# Patient Record
Sex: Male | Born: 1960 | Race: White | Hispanic: No | Marital: Single | Smoking: Former smoker
Health system: Southern US, Community
[De-identification: ages and names within clinical notes are randomized; demographics above are authoritative.]

## PROBLEM LIST (undated history)

## (undated) DIAGNOSIS — R7401 Elevation of levels of liver transaminase levels: Secondary | ICD-10-CM

## (undated) DIAGNOSIS — E119 Type 2 diabetes mellitus without complications: Secondary | ICD-10-CM

## (undated) DIAGNOSIS — K219 Gastro-esophageal reflux disease without esophagitis: Secondary | ICD-10-CM

## (undated) DIAGNOSIS — K769 Liver disease, unspecified: Secondary | ICD-10-CM

## (undated) DIAGNOSIS — M1990 Unspecified osteoarthritis, unspecified site: Secondary | ICD-10-CM

## (undated) DIAGNOSIS — I10 Essential (primary) hypertension: Secondary | ICD-10-CM

## (undated) DIAGNOSIS — E7800 Pure hypercholesterolemia, unspecified: Secondary | ICD-10-CM

## (undated) DIAGNOSIS — R740 Nonspecific elevation of levels of transaminase and lactic acid dehydrogenase [LDH]: Secondary | ICD-10-CM

## (undated) HISTORY — DX: Unspecified osteoarthritis, unspecified site: M19.90

## (undated) HISTORY — PX: COLONOSCOPY: SHX174

## (undated) HISTORY — PX: OTHER SURGICAL HISTORY: SHX169

## (undated) HISTORY — DX: Type 2 diabetes mellitus without complications: E11.9

---

## 2008-09-29 ENCOUNTER — Ambulatory Visit: Payer: Self-pay | Admitting: Anesthesiology

## 2009-02-03 ENCOUNTER — Ambulatory Visit: Payer: Self-pay | Admitting: Anesthesiology

## 2011-08-18 ENCOUNTER — Ambulatory Visit: Payer: Self-pay | Admitting: Pain Medicine

## 2011-08-31 ENCOUNTER — Emergency Department: Payer: Self-pay | Admitting: Emergency Medicine

## 2011-08-31 LAB — CBC
HGB: 14.6 g/dL (ref 13.0–18.0)
MCH: 28.7 pg (ref 26.0–34.0)
MCV: 85 fL (ref 80–100)
Platelet: 223 10*3/uL (ref 150–440)
RBC: 5.07 10*6/uL (ref 4.40–5.90)

## 2011-08-31 LAB — BASIC METABOLIC PANEL
Anion Gap: 8 (ref 7–16)
BUN: 14 mg/dL (ref 7–18)
Calcium, Total: 9 mg/dL (ref 8.5–10.1)
EGFR (African American): >60
EGFR (Non-African Amer.): >60
Glucose: 103 mg/dL — ABNORMAL HIGH (ref 65–99)
Osmolality: 280 (ref 275–301)
Potassium: 3.9 mmol/L (ref 3.5–5.1)

## 2013-01-20 ENCOUNTER — Ambulatory Visit: Payer: Self-pay | Admitting: Physician Assistant

## 2013-03-01 ENCOUNTER — Emergency Department: Payer: Self-pay | Admitting: Emergency Medicine

## 2013-03-01 LAB — URINALYSIS, COMPLETE
BILIRUBIN, UR: NEGATIVE
Bacteria: NONE SEEN
Blood: NEGATIVE
Glucose,UR: NEGATIVE mg/dL (ref 0–75)
Ketone: NEGATIVE
Leukocyte Esterase: NEGATIVE
Nitrite: NEGATIVE
Ph: 5 (ref 4.5–8.0)
Protein: NEGATIVE
RBC,UR: <1 /HPF (ref 0–5)
Specific Gravity: 1.012 (ref 1.003–1.030)
Squamous Epithelial: NONE SEEN
WBC UR: 1 /HPF (ref 0–5)

## 2013-03-01 LAB — LIPASE, BLOOD: Lipase: 192 U/L (ref 73–393)

## 2013-03-01 LAB — CBC
HCT: 46.2 % (ref 40.0–52.0)
HGB: 15.5 g/dL (ref 13.0–18.0)
MCH: 27.8 pg (ref 26.0–34.0)
MCHC: 33.5 g/dL (ref 32.0–36.0)
MCV: 83 fL (ref 80–100)
Platelet: 230 10*3/uL (ref 150–440)
RBC: 5.57 10*6/uL (ref 4.40–5.90)
RDW: 14.6 % — ABNORMAL HIGH (ref 11.5–14.5)
WBC: 13.2 10*3/uL — ABNORMAL HIGH (ref 3.8–10.6)

## 2013-03-01 LAB — COMPREHENSIVE METABOLIC PANEL
ALT: 29 U/L (ref 12–78)
ANION GAP: 4 — AB (ref 7–16)
Albumin: 4.3 g/dL (ref 3.4–5.0)
Alkaline Phosphatase: 124 U/L — ABNORMAL HIGH
BILIRUBIN TOTAL: 0.4 mg/dL (ref 0.2–1.0)
BUN: 18 mg/dL (ref 7–18)
CHLORIDE: 102 mmol/L (ref 98–107)
Calcium, Total: 9.1 mg/dL (ref 8.5–10.1)
Co2: 28 mmol/L (ref 21–32)
Creatinine: 0.95 mg/dL (ref 0.60–1.30)
EGFR (African American): >60
EGFR (Non-African Amer.): >60
GLUCOSE: 81 mg/dL (ref 65–99)
Osmolality: 269 (ref 275–301)
Potassium: 3.4 mmol/L — ABNORMAL LOW (ref 3.5–5.1)
SGOT(AST): 28 U/L (ref 15–37)
Sodium: 134 mmol/L — ABNORMAL LOW (ref 136–145)
Total Protein: 8.5 g/dL — ABNORMAL HIGH (ref 6.4–8.2)

## 2013-03-19 ENCOUNTER — Ambulatory Visit: Payer: Self-pay | Admitting: Unknown Physician Specialty

## 2013-08-14 ENCOUNTER — Ambulatory Visit: Payer: Self-pay | Admitting: Internal Medicine

## 2013-08-14 LAB — CBC CANCER CENTER
BASOS ABS: 0.1 x10 3/mm (ref 0.0–0.1)
Basophil %: 0.8 %
EOS ABS: 0.3 x10 3/mm (ref 0.0–0.7)
Eosinophil %: 2.6 %
HCT: 48.2 % (ref 40.0–52.0)
HGB: 15.9 g/dL (ref 13.0–18.0)
LYMPHS PCT: 20.1 %
Lymphocyte #: 2 x10 3/mm (ref 1.0–3.6)
MCH: 28.9 pg (ref 26.0–34.0)
MCHC: 32.9 g/dL (ref 32.0–36.0)
MCV: 88 fL (ref 80–100)
MONOS PCT: 7.5 %
Monocyte #: 0.7 x10 3/mm (ref 0.2–1.0)
NEUTROS PCT: 69 %
Neutrophil #: 6.8 x10 3/mm — ABNORMAL HIGH (ref 1.4–6.5)
Platelet: 230 x10 3/mm (ref 150–440)
RBC: 5.5 10*6/uL (ref 4.40–5.90)
RDW: 13.6 % (ref 11.5–14.5)
WBC: 9.9 x10 3/mm (ref 3.8–10.6)

## 2013-08-14 LAB — CREATININE, SERUM
Creatinine: 0.96 mg/dL (ref 0.60–1.30)
EGFR (African American): >60
EGFR (Non-African Amer.): >60

## 2013-08-14 LAB — LACTATE DEHYDROGENASE: LDH: 202 U/L (ref 85–241)

## 2013-09-06 ENCOUNTER — Ambulatory Visit: Payer: Self-pay | Admitting: Internal Medicine
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
## 2015-06-25 ENCOUNTER — Encounter: Payer: Self-pay | Admitting: *Deleted

## 2015-06-28 ENCOUNTER — Encounter: Payer: Self-pay | Admitting: *Deleted

## 2015-06-28 ENCOUNTER — Ambulatory Visit
Admission: RE | Admit: 2015-06-28 | Discharge: 2015-06-28 | Disposition: A | Discharge status: Home / Self Care | Payer: Medicaid Other | Source: Ambulatory Visit | Attending: Gastroenterology | Admitting: Gastroenterology

## 2015-06-28 ENCOUNTER — Ambulatory Visit: Payer: Medicaid Other | Admitting: Certified Registered Nurse Anesthetist

## 2015-06-28 ENCOUNTER — Encounter
Admission: RE | Disposition: A | Discharge status: Home / Self Care | Payer: Self-pay | Source: Ambulatory Visit | Attending: Gastroenterology

## 2015-06-28 DIAGNOSIS — K210 Gastro-esophageal reflux disease with esophagitis: Secondary | ICD-10-CM | POA: Diagnosis not present

## 2015-06-28 DIAGNOSIS — I10 Essential (primary) hypertension: Secondary | ICD-10-CM | POA: Insufficient documentation

## 2015-06-28 DIAGNOSIS — Z79899 Other long term (current) drug therapy: Secondary | ICD-10-CM | POA: Insufficient documentation

## 2015-06-28 DIAGNOSIS — E7800 Pure hypercholesterolemia, unspecified: Secondary | ICD-10-CM | POA: Insufficient documentation

## 2015-06-28 DIAGNOSIS — Z1211 Encounter for screening for malignant neoplasm of colon: Secondary | ICD-10-CM | POA: Insufficient documentation

## 2015-06-28 HISTORY — DX: Pure hypercholesterolemia, unspecified: E78.00

## 2015-06-28 HISTORY — PX: ESOPHAGOGASTRODUODENOSCOPY (EGD) WITH PROPOFOL: SHX5813

## 2015-06-28 HISTORY — DX: Elevation of levels of liver transaminase levels: R74.01

## 2015-06-28 HISTORY — DX: Gastro-esophageal reflux disease without esophagitis: K21.9

## 2015-06-28 HISTORY — PX: COLONOSCOPY WITH PROPOFOL: SHX5780

## 2015-06-28 HISTORY — DX: Nonspecific elevation of levels of transaminase and lactic acid dehydrogenase (ldh): R74.0

## 2015-06-28 HISTORY — DX: Liver disease, unspecified: K76.9

## 2015-06-28 HISTORY — DX: Essential (primary) hypertension: I10

## 2015-06-28 SURGERY — COLONOSCOPY WITH PROPOFOL
Anesthesia: General

## 2015-06-28 MED ORDER — SODIUM CHLORIDE 0.9 % IV SOLN: INTRAVENOUS | Status: DC

## 2015-06-28 MED ORDER — FENTANYL CITRATE (PF) 100 MCG/2ML IJ SOLN
INTRAMUSCULAR | Status: DC | PRN
  Administered 2015-06-28: 50 ug via INTRAVENOUS

## 2015-06-28 MED ORDER — LIDOCAINE HCL (CARDIAC) 20 MG/ML IV SOLN
INTRAVENOUS | Status: DC | PRN
  Administered 2015-06-28: 100 mg via INTRAVENOUS

## 2015-06-28 MED ORDER — SODIUM CHLORIDE 0.9 % IV SOLN
INTRAVENOUS | Status: DC
  Administered 2015-06-28: 14:00:00 via INTRAVENOUS

## 2015-06-28 MED ORDER — PROPOFOL 500 MG/50ML IV EMUL
INTRAVENOUS | Status: DC | PRN
  Administered 2015-06-28: 140 ug/kg/min via INTRAVENOUS

## 2015-06-28 MED ORDER — PROPOFOL 10 MG/ML IV BOLUS
INTRAVENOUS | Status: DC | PRN
  Administered 2015-06-28: 80 mg via INTRAVENOUS
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
## 2015-06-28 NOTE — Anesthesia Preprocedure Evaluation (Signed)
Anesthesia Evaluation  Patient identified by MRN, date of birth, ID band Patient awake    Reviewed: Allergy & Precautions, NPO status , Patient's Chart, lab work & pertinent test results  History of Anesthesia Complications Negative for: history of anesthetic complications  Airway Mallampati: III       Dental   Pulmonary neg pulmonary ROS,           Cardiovascular hypertension, Pt. on medications      Neuro/Psych negative neurological ROS     GI/Hepatic Neg liver ROS, GERD  Medicated and Controlled,  Endo/Other  negative endocrine ROS  Renal/GU negative Renal ROS     Musculoskeletal   Abdominal   Peds  Hematology negative hematology ROS (+)   Anesthesia Other Findings   Reproductive/Obstetrics                             Anesthesia Physical Anesthesia Plan  ASA: II  Anesthesia Plan: General   Post-op Pain Management:    Induction: Intravenous  Airway Management Planned: Nasal Cannula  Additional Equipment:   Intra-op Plan:   Post-operative Plan:   Informed Consent: I have reviewed the patients History and Physical, chart, labs and discussed the procedure including the risks, benefits and alternatives for the proposed anesthesia with the patient or authorized representative who has indicated his/her understanding and acceptance.     Plan Discussed with:   Anesthesia Plan Comments:         Anesthesia Quick Evaluation

## 2015-06-28 NOTE — Op Note (Signed)
[facility] Gastroenterology Patient Name: Colton Whitehead Procedure Date: 06/28/2015 2:32 PM MRN: [id number] Account #: [id number] Date of Birth: [birth year] Admit Type: Outpatient Age: 55 Room: [facility] ENDO ROOM 4 Gender: Male Note Status: Finalized Procedure:            Upper GI endoscopy Indications:          Suspected esophageal reflux Providers:            Lupita Dawn. Candace Cruise, MD Referring MD:         Leighton Ruff. Radford Pax, RN (Referring MD) Medicines:            Monitored Anesthesia Care Complications:        No immediate complications. Procedure:            Pre-Anesthesia Assessment:                       - Prior to the procedure, a History and Physical was                        performed, and patient medications, allergies and                        sensitivities were reviewed. The patient's tolerance of                        previous anesthesia was reviewed.                       - The risks and benefits of the procedure and the                        sedation options and risks were discussed with the                        patient. All questions were answered and informed                        consent was obtained.                       - After reviewing the risks and benefits, the patient                        was deemed in satisfactory condition to undergo the                        procedure.                       After obtaining informed consent, the endoscope was                        passed under direct vision. Throughout the procedure,                        the patient's blood pressure, pulse, and oxygen                        saturations were monitored continuously. The Endoscope  was introduced through the mouth, and advanced to the                        second part of duodenum. The upper GI endoscopy was                        accomplished without difficulty. The patient tolerated                        the procedure well. Findings:     LA Grade A (one or more mucosal breaks less than 5 mm, not extending       between tops of 2 mucosal folds) esophagitis was found at the       gastroesophageal junction. Biopsies were taken with a cold forceps for       histology.      The exam was otherwise without abnormality.      The entire examined stomach was normal.      The examined duodenum was normal. Impression:           - LA Grade A reflux esophagitis. Rule out Barrett's                        esophagus. Biopsied.                       - The examination was otherwise normal.                       - Normal stomach.                       - Normal examined duodenum. Recommendation:       - Discharge patient to home.                       - Observe patient's clinical course.                       - Continue present medications.                       - Await pathology results.                       - The findings and recommendations were discussed with                        the patient.                       - Chck for Barrett's Procedure Code(s):    --- Professional ---                       [phone], Esophagogastroduodenoscopy, flexible, transoral;                        with biopsy, single or multiple Diagnosis Code(s):    --- Professional ---                       K21.0, Gastro-esophageal reflux disease with esophagitis CPT copyright 2016 American Medical Association. All rights reserved. The codes documented in this report are preliminary and upon  coder review may  be revised to meet current compliance requirements. Hulen Luster, MD 06/28/2015 2:50:29 PM This report has been signed electronically. Number of Addenda: 0 Note Initiated On: 06/28/2015 2:32 PM      [facility]

## 2015-06-28 NOTE — Transfer of Care (Signed)
Immediate Anesthesia Transfer of Care Note  Patient: Colton Whitehead.  Procedure(s) Performed: Procedure(s): COLONOSCOPY WITH PROPOFOL (N/A) ESOPHAGOGASTRODUODENOSCOPY (EGD) WITH PROPOFOL (N/A)  Patient Location: Endoscopy Unit  Anesthesia Type:General  Level of Consciousness: awake, alert , oriented and patient cooperative  Airway & Oxygen Therapy: Patient Spontanous Breathing and Patient connected to nasal cannula oxygen  Post-op Assessment: Report given to RN, Post -op Vital signs reviewed and stable and Patient moving all extremities X 4  Post vital signs: Reviewed and stable  Last Vitals:  Filed Vitals:   06/28/15 1340  BP: 120/76  Pulse: 72  Temp: 36.2 C  Resp: 16    Last Pain:  Filed Vitals:   06/28/15 1342  PainSc: 5          Complications: No apparent anesthesia complications

## 2015-06-28 NOTE — Anesthesia Postprocedure Evaluation (Signed)
Anesthesia Post Note  Patient: Colton Whitehead.  Procedure(s) Performed: Procedure(s) (LRB): COLONOSCOPY WITH PROPOFOL (N/A) ESOPHAGOGASTRODUODENOSCOPY (EGD) WITH PROPOFOL (N/A)  Patient location during evaluation: PACU Anesthesia Type: General Level of consciousness: awake and alert Pain management: pain level controlled Vital Signs Assessment: post-procedure vital signs reviewed and stable Respiratory status: spontaneous breathing and respiratory function stable Cardiovascular status: stable Anesthetic complications: no    Last Vitals:  Filed Vitals:   06/28/15 1524 06/28/15 1534  BP: 112/79 118/84  Pulse: 63 64  Temp:    Resp: 16 17    Last Pain:  Filed Vitals:   06/28/15 1539  PainSc: 5                  Dietrick Barris K

## 2015-06-28 NOTE — Op Note (Signed)
[facility] Gastroenterology Patient Name: Colton Whitehead Procedure Date: 06/28/2015 2:31 PM MRN: [id number] Account #: [id number] Date of Birth: 11-06-1960 Admit Type: Outpatient Age: 55 Room: [facility] ENDO ROOM 4 Gender: Male Note Status: Finalized Procedure:            Colonoscopy Indications:          Screening for colorectal malignant neoplasm Providers:            Lupita Dawn. Candace Cruise, MD Medicines:            Monitored Anesthesia Care Complications:        No immediate complications. Procedure:            Pre-Anesthesia Assessment:                       - Prior to the procedure, a History and Physical was                        performed, and patient medications, allergies and                        sensitivities were reviewed. The patient's tolerance of                        previous anesthesia was reviewed.                       - The risks and benefits of the procedure and the                        sedation options and risks were discussed with the                        patient. All questions were answered and informed                        consent was obtained.                       - After reviewing the risks and benefits, the patient                        was deemed in satisfactory condition to undergo the                        procedure.                       After obtaining informed consent, the colonoscope was                        passed under direct vision. Throughout the procedure,                        the patient's blood pressure, pulse, and oxygen                        saturations were monitored continuously. The                        Colonoscope was introduced through the anus and  advanced to the the cecum, identified by appendiceal                        orifice and ileocecal valve. The colonoscopy was                        performed without difficulty. The patient tolerated the                        procedure well. The  quality of the bowel preparation                        was fair. Findings:      The colon (entire examined portion) appeared normal. Impression:           - Preparation of the colon was fair.                       - The entire examined colon is normal.                       - No specimens collected. Recommendation:       - Discharge patient to home.                       - Repeat colonoscopy in 10 years for surveillance.                       - The findings and recommendations were discussed with                        the patient. Procedure Code(s):    --- Professional ---                       [phone], Colonoscopy, flexible; diagnostic, including                        collection of specimen(s) by brushing or washing, when                        performed (separate procedure) Diagnosis Code(s):    --- Professional ---                       Z12.11, Encounter for screening for malignant neoplasm                        of colon CPT copyright 2016 American Medical Association. All rights reserved. The codes documented in this report are preliminary and upon coder review may  be revised to meet current compliance requirements. Hulen Luster, MD 06/28/2015 2:59:00 PM This report has been signed electronically. Number of Addenda: 0 Note Initiated On: 06/28/2015 2:31 PM Scope Withdrawal Time: 0 hours 4 minutes 9 seconds  Total Procedure Duration: 0 hours 6 minutes 4 seconds       [facility]

## 2015-06-29 ENCOUNTER — Encounter: Payer: Self-pay | Admitting: Gastroenterology

## 2015-06-30 LAB — SURGICAL PATHOLOGY

## 2015-09-27 ENCOUNTER — Other Ambulatory Visit: Payer: Self-pay | Admitting: Gastroenterology

## 2015-09-27 DIAGNOSIS — R188 Other ascites: Secondary | ICD-10-CM

## 2015-09-27 DIAGNOSIS — B182 Chronic viral hepatitis C: Secondary | ICD-10-CM

## 2015-10-01 ENCOUNTER — Other Ambulatory Visit: Payer: Self-pay | Admitting: Gastroenterology

## 2015-10-01 ENCOUNTER — Ambulatory Visit
Admission: RE | Admit: 2015-10-01 | Discharge: 2015-10-01 | Disposition: A | Discharge status: Home / Self Care | Payer: Medicaid Other | Source: Ambulatory Visit | Attending: Gastroenterology | Admitting: Gastroenterology

## 2015-10-01 DIAGNOSIS — R188 Other ascites: Secondary | ICD-10-CM

## 2015-10-01 DIAGNOSIS — B182 Chronic viral hepatitis C: Secondary | ICD-10-CM

## 2015-11-11 ENCOUNTER — Other Ambulatory Visit: Payer: Self-pay | Admitting: Gastroenterology

## 2015-11-11 DIAGNOSIS — R1084 Generalized abdominal pain: Secondary | ICD-10-CM

## 2015-11-11 DIAGNOSIS — R635 Abnormal weight gain: Secondary | ICD-10-CM

## 2015-11-15 ENCOUNTER — Ambulatory Visit
Admission: RE | Admit: 2015-11-15 | Discharge: 2015-11-15 | Disposition: A | Discharge status: Home / Self Care | Payer: Medicaid Other | Source: Ambulatory Visit | Attending: Gastroenterology | Admitting: Gastroenterology

## 2015-11-15 DIAGNOSIS — K760 Fatty (change of) liver, not elsewhere classified: Secondary | ICD-10-CM | POA: Insufficient documentation

## 2015-11-15 DIAGNOSIS — N329 Bladder disorder, unspecified: Secondary | ICD-10-CM | POA: Insufficient documentation

## 2015-11-15 DIAGNOSIS — N420 Calculus of prostate: Secondary | ICD-10-CM | POA: Insufficient documentation

## 2015-11-15 DIAGNOSIS — R635 Abnormal weight gain: Secondary | ICD-10-CM | POA: Insufficient documentation

## 2015-11-15 DIAGNOSIS — R1084 Generalized abdominal pain: Secondary | ICD-10-CM | POA: Diagnosis not present

## 2015-11-15 MED ORDER — IOPAMIDOL (ISOVUE-370) INJECTION 76%
100.0000 mL | Freq: Once | INTRAVENOUS | Status: AC | PRN
  Administered 2015-11-15: 100 mL via INTRAVENOUS

## 2016-04-05 ENCOUNTER — Encounter: Payer: Self-pay | Admitting: *Deleted

## 2016-04-06 ENCOUNTER — Encounter: Payer: Self-pay | Admitting: General Surgery

## 2016-04-06 ENCOUNTER — Ambulatory Visit (INDEPENDENT_AMBULATORY_CARE_PROVIDER_SITE_OTHER): Payer: Medicaid Other | Admitting: General Surgery

## 2016-04-06 VITALS — Ht 69.0 in | Wt 248.0 lb

## 2016-04-06 DIAGNOSIS — M6208 Separation of muscle (nontraumatic), other site: Secondary | ICD-10-CM
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
## 2016-06-29 ENCOUNTER — Other Ambulatory Visit: Payer: Self-pay | Admitting: Gastroenterology

## 2016-06-29 DIAGNOSIS — K760 Fatty (change of) liver, not elsewhere classified: Secondary | ICD-10-CM

## 2016-06-29 DIAGNOSIS — B182 Chronic viral hepatitis C: Secondary | ICD-10-CM

## 2016-07-05 ENCOUNTER — Ambulatory Visit
Admission: RE | Admit: 2016-07-05 | Discharge: 2016-07-05 | Disposition: A | Discharge status: Home / Self Care | Payer: Medicaid Other | Source: Ambulatory Visit | Attending: Gastroenterology | Admitting: Gastroenterology

## 2016-07-05 DIAGNOSIS — K760 Fatty (change of) liver, not elsewhere classified: Secondary | ICD-10-CM

## 2016-07-05 DIAGNOSIS — B182 Chronic viral hepatitis C: Secondary | ICD-10-CM

## 2016-07-06 ENCOUNTER — Ambulatory Visit
Admission: RE | Admit: 2016-07-06 | Discharge: 2016-07-06 | Disposition: A | Discharge status: Home / Self Care | Payer: Medicaid Other | Source: Ambulatory Visit | Attending: Gastroenterology | Admitting: Gastroenterology

## 2016-07-06 DIAGNOSIS — B182 Chronic viral hepatitis C: Secondary | ICD-10-CM | POA: Diagnosis present

## 2016-07-06 DIAGNOSIS — K769 Liver disease, unspecified: Secondary | ICD-10-CM | POA: Diagnosis not present

## 2016-07-06 DIAGNOSIS — R188 Other ascites: Secondary | ICD-10-CM | POA: Diagnosis present

## 2016-07-06 DIAGNOSIS — K760 Fatty (change of) liver, not elsewhere classified: Secondary | ICD-10-CM | POA: Diagnosis present

## 2016-11-21 ENCOUNTER — Other Ambulatory Visit
Admission: RE | Admit: 2016-11-21 | Discharge: 2016-11-21 | Disposition: A | Discharge status: Home / Self Care | Payer: Medicaid Other | Source: Ambulatory Visit | Attending: Nurse Practitioner | Admitting: Nurse Practitioner

## 2016-11-21 DIAGNOSIS — I10 Essential (primary) hypertension: Secondary | ICD-10-CM | POA: Insufficient documentation

## 2016-11-21 LAB — COMPREHENSIVE METABOLIC PANEL
ALBUMIN: 4.2 g/dL (ref 3.5–5.0)
ALK PHOS: 103 U/L (ref 38–126)
ALT: 150 U/L — AB (ref 17–63)
ANION GAP: 14 (ref 5–15)
AST: 133 U/L — AB (ref 15–41)
BUN: 11 mg/dL (ref 6–20)
CO2: 25 mmol/L (ref 22–32)
CREATININE: 0.74 mg/dL (ref 0.61–1.24)
Calcium: 9.6 mg/dL (ref 8.9–10.3)
Chloride: 96 mmol/L — ABNORMAL LOW (ref 101–111)
GFR calc Af Amer: >60 mL/min (ref >60)
GFR calc non Af Amer: >60 mL/min (ref >60)
GLUCOSE: 177 mg/dL — AB (ref 65–99)
Potassium: 3.8 mmol/L (ref 3.5–5.1)
SODIUM: 135 mmol/L (ref 135–145)
TOTAL PROTEIN: 8.3 g/dL — AB (ref 6.5–8.1)
Total Bilirubin: 1.1 mg/dL (ref 0.3–1.2)

## 2017-02-15 ENCOUNTER — Other Ambulatory Visit: Payer: Self-pay | Admitting: Gastroenterology

## 2017-02-15 DIAGNOSIS — R945 Abnormal results of liver function studies: Principal | ICD-10-CM

## 2017-02-15 DIAGNOSIS — R7989 Other specified abnormal findings of blood chemistry: Secondary | ICD-10-CM

## 2017-02-21 ENCOUNTER — Ambulatory Visit: Payer: Medicaid Other

## 2017-03-16 ENCOUNTER — Ambulatory Visit
Admission: RE | Admit: 2017-03-16 | Discharge: 2017-03-16 | Disposition: A | Discharge status: Home / Self Care | Payer: Medicaid Other | Source: Ambulatory Visit | Attending: Gastroenterology | Admitting: Gastroenterology

## 2017-03-16 DIAGNOSIS — R945 Abnormal results of liver function studies: Secondary | ICD-10-CM | POA: Insufficient documentation

## 2017-03-16 DIAGNOSIS — K8020 Calculus of gallbladder without cholecystitis without obstruction: Secondary | ICD-10-CM | POA: Diagnosis not present

## 2017-03-16 DIAGNOSIS — K760 Fatty (change of) liver, not elsewhere classified: Secondary | ICD-10-CM | POA: Diagnosis not present

## 2017-03-16 DIAGNOSIS — R7989 Other specified abnormal findings of blood chemistry: Secondary | ICD-10-CM

## 2017-03-20 ENCOUNTER — Ambulatory Visit: Payer: Medicaid Other | Admitting: Nurse Practitioner

## 2017-03-20 ENCOUNTER — Encounter: Payer: Self-pay | Admitting: Nurse Practitioner

## 2017-03-20 VITALS — BP 129/86 | HR 98 | Resp 16 | Ht 69.0 in | Wt 229.8 lb

## 2017-03-20 DIAGNOSIS — E1165 Type 2 diabetes mellitus with hyperglycemia: Secondary | ICD-10-CM | POA: Diagnosis not present

## 2017-03-20 DIAGNOSIS — I10 Essential (primary) hypertension: Secondary | ICD-10-CM

## 2017-03-20 DIAGNOSIS — IMO0002 Reserved for concepts with insufficient information to code with codable children: Secondary | ICD-10-CM | POA: Insufficient documentation

## 2017-03-20 DIAGNOSIS — K219 Gastro-esophageal reflux disease without esophagitis: Secondary | ICD-10-CM | POA: Diagnosis not present

## 2017-03-20 DIAGNOSIS — E1159 Type 2 diabetes mellitus with other circulatory complications: Secondary | ICD-10-CM | POA: Insufficient documentation

## 2017-03-20 LAB — POCT GLYCOSYLATED HEMOGLOBIN (HGB A1C): Hemoglobin A1C: 6.6

## 2017-03-20 MED ORDER — ESOMEPRAZOLE MAGNESIUM 40 MG PO CPDR: 40.0000 mg | DELAYED_RELEASE_CAPSULE | Freq: Every day | ORAL | 5 refills | Status: DC
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
## 2017-03-28 DIAGNOSIS — K219 Gastro-esophageal reflux disease without esophagitis: Secondary | ICD-10-CM | POA: Insufficient documentation

## 2017-05-21 ENCOUNTER — Other Ambulatory Visit: Payer: Self-pay | Admitting: Internal Medicine

## 2017-06-19 ENCOUNTER — Encounter: Payer: Self-pay | Admitting: Nurse Practitioner

## 2017-06-19 ENCOUNTER — Ambulatory Visit: Payer: Medicaid Other | Admitting: Nurse Practitioner

## 2017-06-19 VITALS — BP 130/89 | HR 98 | Resp 16 | Ht 69.0 in | Wt 230.8 lb

## 2017-06-19 DIAGNOSIS — E11649 Type 2 diabetes mellitus with hypoglycemia without coma: Secondary | ICD-10-CM

## 2017-06-19 DIAGNOSIS — K219 Gastro-esophageal reflux disease without esophagitis: Secondary | ICD-10-CM | POA: Diagnosis not present

## 2017-06-19 DIAGNOSIS — Z23 Encounter for immunization: Secondary | ICD-10-CM | POA: Diagnosis not present

## 2017-06-19 DIAGNOSIS — S30861A Insect bite (nonvenomous) of abdominal wall, initial encounter: Secondary | ICD-10-CM | POA: Diagnosis not present

## 2017-06-19 DIAGNOSIS — I10 Essential (primary) hypertension: Secondary | ICD-10-CM

## 2017-06-19 DIAGNOSIS — W57XXXA Bitten or stung by nonvenomous insect and other nonvenomous arthropods, initial encounter: Secondary | ICD-10-CM

## 2017-06-19 LAB — POCT GLYCOSYLATED HEMOGLOBIN (HGB A1C): HEMOGLOBIN A1C: 7.1

## 2017-06-19 MED ORDER — PNEUMOCOCCAL VAC POLYVALENT 25 MCG/0.5ML IJ INJ: 0.5000 mL | INJECTION | INTRAMUSCULAR | 0 refills | Status: AC

## 2017-06-19 MED ORDER — DOXYCYCLINE HYCLATE 100 MG PO TABS: 100.0000 mg | ORAL_TABLET | Freq: Two times a day (BID) | ORAL | 0 refills | Status: DC

## 2017-06-19 MED ORDER — METFORMIN HCL 500 MG PO TABS: 250.0000 mg | ORAL_TABLET | Freq: Two times a day (BID) | ORAL | 3 refills | Status: DC
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
## 2017-07-22 ENCOUNTER — Emergency Department: Payer: Medicaid Other

## 2017-07-22 ENCOUNTER — Emergency Department
Admission: EM | Admit: 2017-07-22 | Discharge: 2017-07-22 | Disposition: A | Discharge status: Home / Self Care | Payer: Medicaid Other | Attending: Emergency Medicine | Admitting: Emergency Medicine

## 2017-07-22 ENCOUNTER — Other Ambulatory Visit: Payer: Self-pay

## 2017-07-22 ENCOUNTER — Encounter: Payer: Self-pay | Admitting: Emergency Medicine

## 2017-07-22 DIAGNOSIS — Y939 Activity, unspecified: Secondary | ICD-10-CM | POA: Diagnosis not present

## 2017-07-22 DIAGNOSIS — S161XXA Strain of muscle, fascia and tendon at neck level, initial encounter: Secondary | ICD-10-CM | POA: Diagnosis not present

## 2017-07-22 DIAGNOSIS — M546 Pain in thoracic spine: Secondary | ICD-10-CM | POA: Diagnosis not present

## 2017-07-22 DIAGNOSIS — I10 Essential (primary) hypertension: Secondary | ICD-10-CM | POA: Diagnosis not present

## 2017-07-22 DIAGNOSIS — Z79899 Other long term (current) drug therapy: Secondary | ICD-10-CM | POA: Diagnosis not present

## 2017-07-22 DIAGNOSIS — Y998 Other external cause status: Secondary | ICD-10-CM | POA: Insufficient documentation

## 2017-07-22 DIAGNOSIS — Z7984 Long term (current) use of oral hypoglycemic drugs: Secondary | ICD-10-CM | POA: Insufficient documentation

## 2017-07-22 DIAGNOSIS — S199XXA Unspecified injury of neck, initial encounter: Secondary | ICD-10-CM | POA: Diagnosis present

## 2017-07-22 DIAGNOSIS — Y92410 Unspecified street and highway as the place of occurrence of the external cause: Secondary | ICD-10-CM | POA: Insufficient documentation

## 2017-07-22 LAB — CBC WITH DIFFERENTIAL/PLATELET
Basophils Absolute: 0 10*3/uL (ref 0–0.1)
Basophils Relative: 0 %
EOS PCT: 1 %
Eosinophils Absolute: 0.1 10*3/uL (ref 0–0.7)
HEMATOCRIT: 43.8 % (ref 40.0–52.0)
Hemoglobin: 15.4 g/dL (ref 13.0–18.0)
LYMPHS ABS: 1.3 10*3/uL (ref 1.0–3.6)
LYMPHS PCT: 14 %
MCH: 31.6 pg (ref 26.0–34.0)
MCHC: 35.1 g/dL (ref 32.0–36.0)
MCV: 90.1 fL (ref 80.0–100.0)
Monocytes Absolute: 0.8 10*3/uL (ref 0.2–1.0)
Monocytes Relative: 8 %
Neutro Abs: 7.2 10*3/uL — ABNORMAL HIGH (ref 1.4–6.5)
Neutrophils Relative %: 77 %
PLATELETS: 187 10*3/uL (ref 150–440)
RBC: 4.86 MIL/uL (ref 4.40–5.90)
RDW: 13 % (ref 11.5–14.5)
WBC: 9.4 10*3/uL (ref 3.8–10.6)

## 2017-07-22 LAB — COMPREHENSIVE METABOLIC PANEL
ALBUMIN: 4.1 g/dL (ref 3.5–5.0)
ALT: 82 U/L — ABNORMAL HIGH (ref 17–63)
AST: 82 U/L — AB (ref 15–41)
Alkaline Phosphatase: 92 U/L (ref 38–126)
Anion gap: 13 (ref 5–15)
BUN: 11 mg/dL (ref 6–20)
CHLORIDE: 101 mmol/L (ref 101–111)
CO2: 23 mmol/L (ref 22–32)
Calcium: 9.3 mg/dL (ref 8.9–10.3)
Creatinine, Ser: 0.89 mg/dL (ref 0.61–1.24)
GFR calc Af Amer: >60 mL/min (ref >60)
GLUCOSE: 123 mg/dL — AB (ref 65–99)
POTASSIUM: 3.5 mmol/L (ref 3.5–5.1)
Sodium: 137 mmol/L (ref 135–145)
Total Bilirubin: 0.7 mg/dL (ref 0.3–1.2)
Total Protein: 8.1 g/dL (ref 6.5–8.1)

## 2017-07-22 LAB — TYPE AND SCREEN
ABO/RH(D): AB POS
ANTIBODY SCREEN: NEGATIVE

## 2017-07-22 MED ORDER — NAPROXEN 500 MG PO TABS: 500.0000 mg | ORAL_TABLET | Freq: Two times a day (BID) | ORAL | 0 refills | Status: DC

## 2017-07-22 MED ORDER — IOPAMIDOL (ISOVUE-300) INJECTION 61%
125.0000 mL | Freq: Once | INTRAVENOUS | Status: DC | PRN
  Filled 2017-07-22: qty 150

## 2017-07-22 MED ORDER — BACLOFEN 10 MG PO TABS: 10.0000 mg | ORAL_TABLET | Freq: Three times a day (TID) | ORAL | 0 refills | Status: DC

## 2017-07-22 MED ORDER — IOHEXOL 300 MG/ML  SOLN
125.0000 mL | Freq: Once | INTRAMUSCULAR | Status: AC | PRN
  Administered 2017-07-22: 125 mL via INTRAVENOUS
  Filled 2017-07-22: qty 125
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
## 2017-07-22 NOTE — ED Notes (Signed)
Taken to ct via stretcher   

## 2017-08-13 ENCOUNTER — Other Ambulatory Visit: Payer: Self-pay | Admitting: Internal Medicine

## 2017-08-13 ENCOUNTER — Telehealth: Payer: Self-pay

## 2017-08-13 DIAGNOSIS — H1013 Acute atopic conjunctivitis, bilateral: Secondary | ICD-10-CM

## 2017-08-13 MED ORDER — OLOPATADINE HCL 0.2 % OP SOLN: OPHTHALMIC | 5 refills | Status: DC
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
## 2017-08-14 NOTE — Telephone Encounter (Signed)
Pt was notified.  

## 2017-09-20 ENCOUNTER — Ambulatory Visit: Payer: Medicaid Other | Admitting: Adult Health

## 2017-09-20 ENCOUNTER — Encounter: Payer: Self-pay | Admitting: Adult Health

## 2017-09-20 ENCOUNTER — Other Ambulatory Visit: Payer: Self-pay | Admitting: Adult Health

## 2017-09-20 DIAGNOSIS — R300 Dysuria: Secondary | ICD-10-CM

## 2017-09-20 DIAGNOSIS — K219 Gastro-esophageal reflux disease without esophagitis: Secondary | ICD-10-CM

## 2017-09-20 DIAGNOSIS — M549 Dorsalgia, unspecified: Secondary | ICD-10-CM

## 2017-09-20 DIAGNOSIS — Z0001 Encounter for general adult medical examination with abnormal findings: Secondary | ICD-10-CM | POA: Diagnosis not present

## 2017-09-20 DIAGNOSIS — E1165 Type 2 diabetes mellitus with hyperglycemia: Secondary | ICD-10-CM | POA: Diagnosis not present

## 2017-09-20 DIAGNOSIS — I10 Essential (primary) hypertension: Secondary | ICD-10-CM

## 2017-09-20 LAB — POCT GLYCOSYLATED HEMOGLOBIN (HGB A1C): Hemoglobin A1C: 6.7 % — AB (ref 4.0–5.6)

## 2017-09-20 MED ORDER — PNEUMOCOCCAL 13-VAL CONJ VACC IM SUSP: 0.5000 mL | INTRAMUSCULAR | 0 refills | Status: AC
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
## 2017-09-20 NOTE — Patient Instructions (Signed)

## 2017-09-21 LAB — UA/M W/RFLX CULTURE, ROUTINE
Bilirubin, UA: NEGATIVE
Glucose, UA: NEGATIVE
KETONES UA: NEGATIVE
LEUKOCYTES UA: NEGATIVE
NITRITE UA: NEGATIVE
Protein, UA: NEGATIVE
RBC UA: NEGATIVE
Specific Gravity, UA: 1.005 (ref 1.005–1.030)
UUROB: 0.2 mg/dL (ref 0.2–1.0)
pH, UA: 5.5 (ref 5.0–7.5)

## 2017-09-21 LAB — MICROSCOPIC EXAMINATION
Casts: NONE SEEN /lpf
Epithelial Cells (non renal): NONE SEEN /hpf (ref 0–10)
RBC, UA: NONE SEEN /hpf (ref 0–2)

## 2017-12-07 ENCOUNTER — Other Ambulatory Visit: Payer: Self-pay | Admitting: Nurse Practitioner

## 2017-12-07 DIAGNOSIS — K219 Gastro-esophageal reflux disease without esophagitis: Secondary | ICD-10-CM

## 2017-12-24 ENCOUNTER — Encounter: Payer: Self-pay | Admitting: Nurse Practitioner

## 2017-12-24 ENCOUNTER — Ambulatory Visit: Payer: Medicaid Other | Admitting: Nurse Practitioner

## 2017-12-24 VITALS — BP 129/83 | HR 84 | Resp 16 | Ht 69.0 in | Wt 227.0 lb

## 2017-12-24 DIAGNOSIS — I10 Essential (primary) hypertension: Secondary | ICD-10-CM | POA: Diagnosis not present

## 2017-12-24 DIAGNOSIS — E1165 Type 2 diabetes mellitus with hyperglycemia: Secondary | ICD-10-CM

## 2017-12-24 DIAGNOSIS — L600 Ingrowing nail: Secondary | ICD-10-CM

## 2017-12-24 LAB — POCT GLYCOSYLATED HEMOGLOBIN (HGB A1C): HEMOGLOBIN A1C: 5.8 % — AB (ref 4.0–5.6)
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
## 2017-12-25 ENCOUNTER — Telehealth: Payer: Self-pay

## 2017-12-25 DIAGNOSIS — L600 Ingrowing nail: Secondary | ICD-10-CM | POA: Insufficient documentation

## 2017-12-25 NOTE — Telephone Encounter (Signed)
Faxed diabetic shoe paperwork to [facility]

## 2017-12-27 ENCOUNTER — Telehealth: Payer: Self-pay | Admitting: Nurse Practitioner

## 2017-12-27 NOTE — Telephone Encounter (Signed)
I finished it and gave it to [facility] on Monday.

## 2018-02-14 ENCOUNTER — Other Ambulatory Visit: Payer: Self-pay | Admitting: Gastroenterology

## 2018-02-14 DIAGNOSIS — R7989 Other specified abnormal findings of blood chemistry: Secondary | ICD-10-CM

## 2018-02-14 DIAGNOSIS — R945 Abnormal results of liver function studies: Principal | ICD-10-CM

## 2018-02-14 DIAGNOSIS — B182 Chronic viral hepatitis C: Secondary | ICD-10-CM

## 2018-02-22 ENCOUNTER — Ambulatory Visit: Payer: No Typology Code available for payment source

## 2018-02-26 ENCOUNTER — Ambulatory Visit
Admission: RE | Admit: 2018-02-26 | Discharge: 2018-02-26 | Disposition: A | Discharge status: Home / Self Care | Payer: Medicaid Other | Source: Ambulatory Visit | Attending: Gastroenterology | Admitting: Gastroenterology

## 2018-02-26 DIAGNOSIS — B182 Chronic viral hepatitis C: Secondary | ICD-10-CM | POA: Diagnosis present

## 2018-02-26 DIAGNOSIS — R945 Abnormal results of liver function studies: Secondary | ICD-10-CM | POA: Insufficient documentation

## 2018-02-26 DIAGNOSIS — R7989 Other specified abnormal findings of blood chemistry: Secondary | ICD-10-CM

## 2018-03-21 ENCOUNTER — Other Ambulatory Visit: Payer: Self-pay

## 2018-03-21 MED ORDER — GLUCOSE BLOOD VI STRP: ORAL_STRIP | 3 refills | Status: DC

## 2018-03-21 MED ORDER — MELOXICAM 7.5 MG PO TABS: 7.5000 mg | ORAL_TABLET | Freq: Every day | ORAL | 1 refills | Status: DC

## 2018-04-26 ENCOUNTER — Encounter: Payer: Self-pay | Admitting: Nurse Practitioner

## 2018-04-26 ENCOUNTER — Ambulatory Visit: Payer: Medicaid Other | Admitting: Nurse Practitioner

## 2018-04-26 ENCOUNTER — Other Ambulatory Visit: Payer: Self-pay

## 2018-04-26 VITALS — BP 124/90 | HR 77 | Resp 16 | Ht 69.0 in | Wt 223.8 lb

## 2018-04-26 DIAGNOSIS — E1165 Type 2 diabetes mellitus with hyperglycemia: Secondary | ICD-10-CM

## 2018-04-26 DIAGNOSIS — H1013 Acute atopic conjunctivitis, bilateral: Secondary | ICD-10-CM | POA: Diagnosis not present

## 2018-04-26 DIAGNOSIS — I10 Essential (primary) hypertension: Secondary | ICD-10-CM

## 2018-04-26 DIAGNOSIS — E11649 Type 2 diabetes mellitus with hypoglycemia without coma: Secondary | ICD-10-CM

## 2018-04-26 DIAGNOSIS — K219 Gastro-esophageal reflux disease without esophagitis: Secondary | ICD-10-CM | POA: Diagnosis not present

## 2018-04-26 LAB — POCT GLYCOSYLATED HEMOGLOBIN (HGB A1C): Hemoglobin A1C: 5.6 % (ref 4.0–5.6)

## 2018-04-26 MED ORDER — OLOPATADINE HCL 0.2 % OP SOLN: OPHTHALMIC | 5 refills | Status: DC

## 2018-04-26 MED ORDER — METFORMIN HCL 500 MG PO TABS: 250.0000 mg | ORAL_TABLET | Freq: Two times a day (BID) | ORAL | 3 refills | Status: DC

## 2018-04-26 MED ORDER — LISINOPRIL 10 MG PO TABS: 10.0000 mg | ORAL_TABLET | Freq: Every morning | ORAL | 11 refills | Status: DC

## 2018-04-26 MED ORDER — NEXIUM 40 MG PO CPDR: 40.0000 mg | DELAYED_RELEASE_CAPSULE | Freq: Every day | ORAL | 5 refills | Status: DC
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
## 2018-05-13 ENCOUNTER — Other Ambulatory Visit: Payer: Self-pay

## 2018-05-13 ENCOUNTER — Telehealth: Payer: Self-pay

## 2018-05-13 NOTE — Telephone Encounter (Signed)
lmom to call us back that he still taking meloxicam or not

## 2018-05-14 ENCOUNTER — Other Ambulatory Visit: Payer: Self-pay

## 2018-05-14 MED ORDER — MELOXICAM 7.5 MG PO TABS: 7.5000 mg | ORAL_TABLET | Freq: Two times a day (BID) | ORAL | 2 refills | Status: DC

## 2018-07-09 ENCOUNTER — Other Ambulatory Visit: Payer: Self-pay | Admitting: Internal Medicine

## 2018-08-02 ENCOUNTER — Encounter: Payer: Self-pay | Admitting: Nurse Practitioner

## 2018-08-02 ENCOUNTER — Ambulatory Visit: Payer: Medicaid Other | Admitting: Nurse Practitioner

## 2018-08-02 VITALS — BP 114/76 | HR 77 | Resp 16 | Ht 69.0 in | Wt 222.0 lb

## 2018-08-02 DIAGNOSIS — E1165 Type 2 diabetes mellitus with hyperglycemia: Secondary | ICD-10-CM | POA: Diagnosis not present

## 2018-08-02 DIAGNOSIS — L247 Irritant contact dermatitis due to plants, except food: Secondary | ICD-10-CM | POA: Insufficient documentation

## 2018-08-02 DIAGNOSIS — I10 Essential (primary) hypertension: Secondary | ICD-10-CM | POA: Diagnosis not present

## 2018-08-02 LAB — POCT GLYCOSYLATED HEMOGLOBIN (HGB A1C): Hemoglobin A1C: 5.8 % — AB (ref 4.0–5.6)

## 2018-08-02 MED ORDER — TRIAMCINOLONE ACETONIDE 0.025 % EX CREA: 1.0000 "application " | TOPICAL_CREAM | Freq: Two times a day (BID) | CUTANEOUS | 2 refills | Status: DC

## 2018-08-02 MED ORDER — METHYLPREDNISOLONE 4 MG PO TBPK: ORAL_TABLET | ORAL | 0 refills | Status: DC
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
## 2018-09-09 ENCOUNTER — Other Ambulatory Visit: Payer: Self-pay

## 2018-09-09 DIAGNOSIS — E1165 Type 2 diabetes mellitus with hyperglycemia: Secondary | ICD-10-CM

## 2018-09-09 MED ORDER — METHYLPREDNISOLONE 4 MG PO TBPK: ORAL_TABLET | ORAL | 0 refills | Status: DC

## 2018-09-10 ENCOUNTER — Encounter: Payer: Self-pay | Admitting: Nurse Practitioner

## 2018-11-07 ENCOUNTER — Ambulatory Visit: Payer: Medicaid Other | Admitting: Adult Health

## 2018-11-07 ENCOUNTER — Other Ambulatory Visit: Payer: Self-pay

## 2018-11-07 ENCOUNTER — Encounter: Payer: Self-pay | Admitting: Adult Health

## 2018-11-07 VITALS — BP 123/73 | HR 74 | Temp 98.2°F | Resp 16 | Ht 69.0 in | Wt 227.0 lb

## 2018-11-07 DIAGNOSIS — Z0001 Encounter for general adult medical examination with abnormal findings: Secondary | ICD-10-CM | POA: Diagnosis not present

## 2018-11-07 DIAGNOSIS — Z23 Encounter for immunization: Secondary | ICD-10-CM | POA: Diagnosis not present

## 2018-11-07 DIAGNOSIS — K769 Liver disease, unspecified: Secondary | ICD-10-CM

## 2018-11-07 DIAGNOSIS — I10 Essential (primary) hypertension: Secondary | ICD-10-CM | POA: Diagnosis not present

## 2018-11-07 DIAGNOSIS — E1165 Type 2 diabetes mellitus with hyperglycemia: Secondary | ICD-10-CM | POA: Diagnosis not present

## 2018-11-07 DIAGNOSIS — H1013 Acute atopic conjunctivitis, bilateral: Secondary | ICD-10-CM

## 2018-11-07 DIAGNOSIS — K219 Gastro-esophageal reflux disease without esophagitis: Secondary | ICD-10-CM | POA: Diagnosis not present

## 2018-11-07 DIAGNOSIS — M549 Dorsalgia, unspecified: Secondary | ICD-10-CM

## 2018-11-07 DIAGNOSIS — R300 Dysuria: Secondary | ICD-10-CM

## 2018-11-07 DIAGNOSIS — Z8619 Personal history of other infectious and parasitic diseases: Secondary | ICD-10-CM

## 2018-11-07 LAB — POCT GLYCOSYLATED HEMOGLOBIN (HGB A1C): Hemoglobin A1C: 5.3 % (ref 4.0–5.6)

## 2018-11-07 MED ORDER — MELOXICAM 7.5 MG PO TABS: 7.5000 mg | ORAL_TABLET | Freq: Every day | ORAL | 6 refills | Status: DC

## 2018-11-07 MED ORDER — OLOPATADINE HCL 0.1 % OP SOLN: 1.0000 [drp] | Freq: Two times a day (BID) | OPHTHALMIC | 12 refills | Status: DC

## 2018-11-07 MED ORDER — METFORMIN HCL 500 MG PO TABS: 250.0000 mg | ORAL_TABLET | Freq: Two times a day (BID) | ORAL | 3 refills | Status: DC

## 2018-11-07 MED ORDER — NEXIUM 40 MG PO CPDR: 40.0000 mg | DELAYED_RELEASE_CAPSULE | Freq: Every day | ORAL | 5 refills | Status: DC
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
## 2018-11-08 ENCOUNTER — Other Ambulatory Visit: Payer: Self-pay

## 2018-11-08 DIAGNOSIS — H1013 Acute atopic conjunctivitis, bilateral: Secondary | ICD-10-CM

## 2018-11-08 LAB — UA/M W/RFLX CULTURE, ROUTINE
Bilirubin, UA: NEGATIVE
Glucose, UA: NEGATIVE
Ketones, UA: NEGATIVE
Leukocytes,UA: NEGATIVE
Nitrite, UA: NEGATIVE
Protein,UA: NEGATIVE
RBC, UA: NEGATIVE
Specific Gravity, UA: 1.019 (ref 1.005–1.030)
Urobilinogen, Ur: 1 mg/dL (ref 0.2–1.0)
pH, UA: 5 (ref 5.0–7.5)

## 2018-11-08 LAB — MICROSCOPIC EXAMINATION
Casts: NONE SEEN /lpf
Epithelial Cells (non renal): NONE SEEN /hpf (ref 0–10)
RBC: NONE SEEN /hpf (ref 0–2)

## 2018-11-08 MED ORDER — OLOPATADINE HCL 0.2 % OP SOLN: OPHTHALMIC | 5 refills | Status: DC

## 2019-01-14 ENCOUNTER — Other Ambulatory Visit (HOSPITAL_COMMUNITY): Payer: Self-pay | Admitting: Gastroenterology

## 2019-01-14 ENCOUNTER — Other Ambulatory Visit: Payer: Self-pay | Admitting: Gastroenterology

## 2019-01-14 DIAGNOSIS — K760 Fatty (change of) liver, not elsewhere classified: Secondary | ICD-10-CM

## 2019-01-21 ENCOUNTER — Ambulatory Visit: Payer: No Typology Code available for payment source

## 2019-02-12 ENCOUNTER — Telehealth: Payer: Self-pay

## 2019-02-12 NOTE — Telephone Encounter (Signed)
Confirmed appointment with patient. klh °

## 2019-02-14 ENCOUNTER — Ambulatory Visit: Payer: Medicaid Other | Admitting: Adult Health

## 2019-02-19 ENCOUNTER — Telehealth: Payer: Self-pay

## 2019-02-19 NOTE — Telephone Encounter (Signed)
Confirmed appointment with patient. klh °

## 2019-02-21 ENCOUNTER — Encounter: Payer: Self-pay | Admitting: Adult Health

## 2019-02-21 ENCOUNTER — Ambulatory Visit: Payer: Medicaid Other | Admitting: Adult Health

## 2019-02-21 VITALS — Ht 69.0 in

## 2019-02-21 DIAGNOSIS — K219 Gastro-esophageal reflux disease without esophagitis: Secondary | ICD-10-CM

## 2019-02-21 DIAGNOSIS — Z8619 Personal history of other infectious and parasitic diseases: Secondary | ICD-10-CM

## 2019-02-21 DIAGNOSIS — I10 Essential (primary) hypertension: Secondary | ICD-10-CM

## 2019-02-21 DIAGNOSIS — E1165 Type 2 diabetes mellitus with hyperglycemia: Secondary | ICD-10-CM

## 2019-02-21 DIAGNOSIS — H1013 Acute atopic conjunctivitis, bilateral: Secondary | ICD-10-CM

## 2019-02-21 MED ORDER — OLOPATADINE HCL 0.2 % OP SOLN: OPHTHALMIC | 5 refills | Status: DC
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
## 2019-03-06 ENCOUNTER — Ambulatory Visit
Admission: RE | Admit: 2019-03-06 | Discharge: 2019-03-06 | Disposition: A | Discharge status: Home / Self Care | Payer: Medicaid Other | Source: Ambulatory Visit | Attending: Gastroenterology | Admitting: Gastroenterology

## 2019-03-06 ENCOUNTER — Other Ambulatory Visit: Payer: Self-pay

## 2019-03-06 DIAGNOSIS — K760 Fatty (change of) liver, not elsewhere classified: Secondary | ICD-10-CM

## 2019-03-11 ENCOUNTER — Other Ambulatory Visit: Payer: Self-pay | Admitting: Gastroenterology

## 2019-03-11 DIAGNOSIS — Z8719 Personal history of other diseases of the digestive system: Secondary | ICD-10-CM

## 2019-03-11 DIAGNOSIS — R932 Abnormal findings on diagnostic imaging of liver and biliary tract: Secondary | ICD-10-CM

## 2019-03-11 DIAGNOSIS — Z8619 Personal history of other infectious and parasitic diseases: Secondary | ICD-10-CM

## 2019-03-23 ENCOUNTER — Ambulatory Visit
Admission: RE | Admit: 2019-03-23 | Discharge: 2019-03-23 | Disposition: A | Discharge status: Home / Self Care | Payer: Medicaid Other | Source: Ambulatory Visit | Attending: Gastroenterology | Admitting: Gastroenterology

## 2019-03-23 ENCOUNTER — Other Ambulatory Visit: Payer: Self-pay

## 2019-03-23 DIAGNOSIS — Z8619 Personal history of other infectious and parasitic diseases: Secondary | ICD-10-CM | POA: Insufficient documentation

## 2019-03-23 DIAGNOSIS — R932 Abnormal findings on diagnostic imaging of liver and biliary tract: Secondary | ICD-10-CM | POA: Diagnosis present

## 2019-03-23 DIAGNOSIS — Z8719 Personal history of other diseases of the digestive system: Secondary | ICD-10-CM | POA: Insufficient documentation

## 2019-03-23 MED ORDER — GADOBUTROL 1 MMOL/ML IV SOLN
10.0000 mL | Freq: Once | INTRAVENOUS | Status: AC | PRN
  Administered 2019-03-23: 10 mL via INTRAVENOUS

## 2019-04-15 ENCOUNTER — Telehealth: Payer: Self-pay

## 2019-04-15 NOTE — Telephone Encounter (Signed)
Confirmed and screened patient for in office appt. Colton Whitehead

## 2019-04-15 NOTE — Telephone Encounter (Signed)
Called lmom informing patient appointment on 04/17/2019. klh

## 2019-04-17 ENCOUNTER — Other Ambulatory Visit: Payer: Self-pay

## 2019-04-17 ENCOUNTER — Encounter: Payer: Self-pay | Admitting: Adult Health

## 2019-04-17 ENCOUNTER — Ambulatory Visit (INDEPENDENT_AMBULATORY_CARE_PROVIDER_SITE_OTHER): Payer: Medicaid Other | Admitting: Adult Health

## 2019-04-17 VITALS — BP 156/91 | HR 72 | Temp 97.8°F | Resp 16 | Ht 69.0 in | Wt 230.0 lb

## 2019-04-17 DIAGNOSIS — E1140 Type 2 diabetes mellitus with diabetic neuropathy, unspecified: Secondary | ICD-10-CM

## 2019-04-17 DIAGNOSIS — I10 Essential (primary) hypertension: Secondary | ICD-10-CM | POA: Diagnosis not present

## 2019-04-17 DIAGNOSIS — E1165 Type 2 diabetes mellitus with hyperglycemia: Secondary | ICD-10-CM

## 2019-04-17 DIAGNOSIS — Z6833 Body mass index (BMI) 33.0-33.9, adult: Secondary | ICD-10-CM

## 2019-04-17 DIAGNOSIS — K219 Gastro-esophageal reflux disease without esophagitis: Secondary | ICD-10-CM

## 2019-04-17 LAB — POCT GLYCOSYLATED HEMOGLOBIN (HGB A1C): Hemoglobin A1C: 5.8 % — AB (ref 4.0–5.6)

## 2019-04-17 MED ORDER — BD SWAB SINGLE USE REGULAR PADS: MEDICATED_PAD | 3 refills | Status: DC

## 2019-04-17 MED ORDER — ACCU-CHEK GUIDE VI STRP: ORAL_STRIP | 3 refills | Status: DC

## 2019-04-17 MED ORDER — GABAPENTIN 100 MG PO CAPS: 100.0000 mg | ORAL_CAPSULE | Freq: Three times a day (TID) | ORAL | 3 refills | Status: DC

## 2019-04-17 MED ORDER — ACCU-CHEK GUIDE VI STRP: ORAL_STRIP | 3 refills | Status: AC
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
## 2019-04-22 ENCOUNTER — Telehealth: Payer: Self-pay

## 2019-04-22 NOTE — Telephone Encounter (Signed)
CMN FOR DIABETIC SHOE SUPPLY COMPLETED AND SIGNED AND FAXED BACK TO [facility] [employer].

## 2019-05-21 ENCOUNTER — Other Ambulatory Visit: Payer: Self-pay

## 2019-05-21 DIAGNOSIS — I10 Essential (primary) hypertension: Secondary | ICD-10-CM

## 2019-05-21 MED ORDER — LISINOPRIL 10 MG PO TABS: 10.0000 mg | ORAL_TABLET | Freq: Every morning | ORAL | 3 refills | Status: DC

## 2019-06-23 ENCOUNTER — Other Ambulatory Visit: Payer: Self-pay | Admitting: Adult Health

## 2019-06-23 DIAGNOSIS — K219 Gastro-esophageal reflux disease without esophagitis: Secondary | ICD-10-CM

## 2019-06-23 DIAGNOSIS — M549 Dorsalgia, unspecified: Secondary | ICD-10-CM

## 2019-07-15 ENCOUNTER — Telehealth: Payer: Self-pay

## 2019-07-15 NOTE — Telephone Encounter (Signed)
Confirmed appointment on 07/17/2019 and screened for covid. klh

## 2019-07-17 ENCOUNTER — Ambulatory Visit (INDEPENDENT_AMBULATORY_CARE_PROVIDER_SITE_OTHER): Payer: Medicaid Other | Admitting: Adult Health

## 2019-07-17 ENCOUNTER — Encounter: Payer: Self-pay | Admitting: Adult Health

## 2019-07-17 VITALS — BP 125/85 | HR 85 | Temp 97.9°F | Resp 16 | Ht 69.0 in | Wt 233.8 lb

## 2019-07-17 DIAGNOSIS — E1165 Type 2 diabetes mellitus with hyperglycemia: Secondary | ICD-10-CM

## 2019-07-17 DIAGNOSIS — I10 Essential (primary) hypertension: Secondary | ICD-10-CM

## 2019-07-17 DIAGNOSIS — Z6834 Body mass index (BMI) 34.0-34.9, adult: Secondary | ICD-10-CM

## 2019-07-17 DIAGNOSIS — E1140 Type 2 diabetes mellitus with diabetic neuropathy, unspecified: Secondary | ICD-10-CM

## 2019-07-17 DIAGNOSIS — K219 Gastro-esophageal reflux disease without esophagitis: Secondary | ICD-10-CM | POA: Diagnosis not present

## 2019-07-17 DIAGNOSIS — S90811A Abrasion, right foot, initial encounter: Secondary | ICD-10-CM

## 2019-07-17 LAB — POCT GLYCOSYLATED HEMOGLOBIN (HGB A1C): Hemoglobin A1C: 5.8 % — AB (ref 4.0–5.6)

## 2019-07-17 MED ORDER — LISINOPRIL 10 MG PO TABS: 10.0000 mg | ORAL_TABLET | Freq: Every morning | ORAL | 3 refills | Status: DC

## 2019-07-17 MED ORDER — METFORMIN HCL 500 MG PO TABS: 250.0000 mg | ORAL_TABLET | Freq: Two times a day (BID) | ORAL | 3 refills | Status: DC

## 2019-07-17 MED ORDER — GABAPENTIN 300 MG PO CAPS: 300.0000 mg | ORAL_CAPSULE | Freq: Two times a day (BID) | ORAL | 3 refills | Status: DC
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
## 2019-09-29 ENCOUNTER — Other Ambulatory Visit: Payer: Self-pay

## 2019-09-29 MED ORDER — EPINEPHRINE 0.3 MG/0.3ML IJ SOAJ: INTRAMUSCULAR | 0 refills | Status: DC

## 2019-10-14 ENCOUNTER — Telehealth: Payer: Self-pay

## 2019-10-14 NOTE — Telephone Encounter (Signed)
Confirmed and screened for 10-16-19 ov.

## 2019-10-14 NOTE — Telephone Encounter (Signed)
LMOM for OV 9/9

## 2019-10-16 ENCOUNTER — Encounter: Payer: Self-pay | Admitting: Hospice and Palliative Medicine

## 2019-10-16 ENCOUNTER — Other Ambulatory Visit: Payer: Self-pay

## 2019-10-16 ENCOUNTER — Ambulatory Visit: Payer: Medicaid Other | Admitting: Hospice and Palliative Medicine

## 2019-10-16 VITALS — BP 116/72 | HR 84 | Temp 98.2°F | Resp 16 | Ht 69.0 in | Wt 230.6 lb

## 2019-10-16 DIAGNOSIS — M5441 Lumbago with sciatica, right side: Secondary | ICD-10-CM

## 2019-10-16 DIAGNOSIS — I10 Essential (primary) hypertension: Secondary | ICD-10-CM

## 2019-10-16 DIAGNOSIS — M5442 Lumbago with sciatica, left side: Secondary | ICD-10-CM

## 2019-10-16 DIAGNOSIS — Z8619 Personal history of other infectious and parasitic diseases: Secondary | ICD-10-CM

## 2019-10-16 DIAGNOSIS — F411 Generalized anxiety disorder: Secondary | ICD-10-CM | POA: Diagnosis not present

## 2019-10-16 DIAGNOSIS — E1165 Type 2 diabetes mellitus with hyperglycemia: Secondary | ICD-10-CM | POA: Diagnosis not present

## 2019-10-16 DIAGNOSIS — G8929 Other chronic pain: Secondary | ICD-10-CM

## 2019-10-16 LAB — POCT GLYCOSYLATED HEMOGLOBIN (HGB A1C): Hemoglobin A1C: 6 % — AB (ref 4.0–5.6)

## 2019-10-16 MED ORDER — ESCITALOPRAM OXALATE 10 MG PO TABS: 10.0000 mg | ORAL_TABLET | Freq: Every day | ORAL | 1 refills | Status: DC

## 2019-10-16 MED ORDER — CYCLOBENZAPRINE HCL 10 MG PO TABS: 10.0000 mg | ORAL_TABLET | Freq: Every day | ORAL | 1 refills | Status: DC
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
## 2019-10-17 ENCOUNTER — Other Ambulatory Visit: Payer: Self-pay

## 2019-10-17 ENCOUNTER — Encounter: Payer: Self-pay | Admitting: Hospice and Palliative Medicine

## 2019-10-17 MED ORDER — ALPRAZOLAM 0.5 MG PO TABS: 0.5000 mg | ORAL_TABLET | Freq: Every evening | ORAL | 0 refills | Status: DC | PRN

## 2019-10-17 NOTE — Telephone Encounter (Signed)
As per taylor called in pres for xanax 0.5 mg  1 tab po QHS as needed  30 with no refills due to pt had transportation

## 2019-10-24 ENCOUNTER — Other Ambulatory Visit: Payer: Self-pay | Admitting: Hospice and Palliative Medicine

## 2019-10-24 DIAGNOSIS — F411 Generalized anxiety disorder: Secondary | ICD-10-CM

## 2019-11-01 ENCOUNTER — Other Ambulatory Visit: Payer: Self-pay | Admitting: Adult Health

## 2019-11-01 DIAGNOSIS — E1140 Type 2 diabetes mellitus with diabetic neuropathy, unspecified: Secondary | ICD-10-CM

## 2019-11-03 ENCOUNTER — Other Ambulatory Visit: Payer: Self-pay

## 2019-11-03 DIAGNOSIS — E1140 Type 2 diabetes mellitus with diabetic neuropathy, unspecified: Secondary | ICD-10-CM

## 2019-11-03 MED ORDER — EPINEPHRINE 0.3 MG/0.3ML IJ SOAJ: INTRAMUSCULAR | 1 refills | Status: DC

## 2019-11-03 MED ORDER — GABAPENTIN 300 MG PO CAPS: 300.0000 mg | ORAL_CAPSULE | Freq: Two times a day (BID) | ORAL | 3 refills | Status: DC

## 2019-11-04 ENCOUNTER — Other Ambulatory Visit: Payer: Self-pay | Admitting: Gastroenterology

## 2019-11-04 DIAGNOSIS — K7469 Other cirrhosis of liver: Secondary | ICD-10-CM

## 2019-11-07 ENCOUNTER — Other Ambulatory Visit: Payer: Self-pay

## 2019-11-07 ENCOUNTER — Ambulatory Visit
Admission: RE | Admit: 2019-11-07 | Discharge: 2019-11-07 | Disposition: A | Discharge status: Home / Self Care | Payer: Medicaid Other | Source: Ambulatory Visit | Attending: Gastroenterology | Admitting: Gastroenterology

## 2019-11-07 DIAGNOSIS — K7469 Other cirrhosis of liver: Secondary | ICD-10-CM

## 2019-11-10 ENCOUNTER — Ambulatory Visit: Payer: Medicaid Other | Admitting: Adult Health

## 2019-11-10 ENCOUNTER — Encounter: Payer: Self-pay | Admitting: Adult Health

## 2019-11-10 VITALS — Resp 16 | Ht 69.0 in | Wt 222.0 lb

## 2019-11-10 DIAGNOSIS — F411 Generalized anxiety disorder: Secondary | ICD-10-CM | POA: Diagnosis not present

## 2019-11-10 DIAGNOSIS — Z125 Encounter for screening for malignant neoplasm of prostate: Secondary | ICD-10-CM

## 2019-11-10 DIAGNOSIS — Z79899 Other long term (current) drug therapy: Secondary | ICD-10-CM

## 2019-11-10 DIAGNOSIS — I10 Essential (primary) hypertension: Secondary | ICD-10-CM | POA: Diagnosis not present

## 2019-11-10 DIAGNOSIS — Z0001 Encounter for general adult medical examination with abnormal findings: Secondary | ICD-10-CM | POA: Diagnosis not present

## 2019-11-10 DIAGNOSIS — E1165 Type 2 diabetes mellitus with hyperglycemia: Secondary | ICD-10-CM

## 2019-11-10 DIAGNOSIS — K219 Gastro-esophageal reflux disease without esophagitis: Secondary | ICD-10-CM

## 2019-11-10 DIAGNOSIS — Z6832 Body mass index (BMI) 32.0-32.9, adult: Secondary | ICD-10-CM

## 2019-11-10 MED ORDER — ALPRAZOLAM 0.5 MG PO TABS: 0.5000 mg | ORAL_TABLET | Freq: Every evening | ORAL | 0 refills | Status: DC | PRN

## 2019-11-10 MED ORDER — LISINOPRIL 10 MG PO TABS: 10.0000 mg | ORAL_TABLET | Freq: Every morning | ORAL | 3 refills | Status: DC
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
## 2019-11-21 LAB — LIPID PANEL WITH LDL/HDL RATIO
Cholesterol, Total: 228 mg/dL — ABNORMAL HIGH (ref 100–199)
HDL: 55 mg/dL (ref >39)
LDL Chol Calc (NIH): 142 mg/dL — ABNORMAL HIGH (ref 0–99)
LDL/HDL Ratio: 2.6 ratio (ref 0.0–3.6)
Triglycerides: 171 mg/dL — ABNORMAL HIGH (ref 0–149)
VLDL Cholesterol Cal: 31 mg/dL (ref 5–40)

## 2019-11-21 LAB — CBC WITH DIFFERENTIAL/PLATELET
Basophils Absolute: 0.1 10*3/uL (ref 0.0–0.2)
Basos: 1 %
EOS (ABSOLUTE): 0.2 10*3/uL (ref 0.0–0.4)
Eos: 2 %
Hematocrit: 48.4 % (ref 37.5–51.0)
Hemoglobin: 16.4 g/dL (ref 13.0–17.7)
Immature Grans (Abs): 0 10*3/uL (ref 0.0–0.1)
Immature Granulocytes: 0 %
Lymphocytes Absolute: 2 10*3/uL (ref 0.7–3.1)
Lymphs: 18 %
MCH: 30.9 pg (ref 26.6–33.0)
MCHC: 33.9 g/dL (ref 31.5–35.7)
MCV: 91 fL (ref 79–97)
Monocytes Absolute: 0.8 10*3/uL (ref 0.1–0.9)
Monocytes: 7 %
Neutrophils Absolute: 8.1 10*3/uL — ABNORMAL HIGH (ref 1.4–7.0)
Neutrophils: 72 %
Platelets: 234 10*3/uL (ref 150–450)
RBC: 5.3 x10E6/uL (ref 4.14–5.80)
RDW: 12.6 % (ref 11.6–15.4)
WBC: 11.3 10*3/uL — ABNORMAL HIGH (ref 3.4–10.8)

## 2019-11-21 LAB — COMPREHENSIVE METABOLIC PANEL
ALT: 71 IU/L — ABNORMAL HIGH (ref 0–44)
AST: 58 IU/L — ABNORMAL HIGH (ref 0–40)
Albumin/Globulin Ratio: 1.6 (ref 1.2–2.2)
Albumin: 5 g/dL — ABNORMAL HIGH (ref 3.8–4.9)
Alkaline Phosphatase: 96 IU/L (ref 44–121)
BUN/Creatinine Ratio: 8 — ABNORMAL LOW (ref 9–20)
BUN: 8 mg/dL (ref 6–24)
Bilirubin Total: 0.8 mg/dL (ref 0.0–1.2)
CO2: 24 mmol/L (ref 20–29)
Calcium: 9.9 mg/dL (ref 8.7–10.2)
Chloride: 98 mmol/L (ref 96–106)
Creatinine, Ser: 1.04 mg/dL (ref 0.76–1.27)
GFR calc Af Amer: 90 mL/min/{1.73_m2} (ref >59)
GFR calc non Af Amer: 78 mL/min/{1.73_m2} (ref >59)
Globulin, Total: 3.1 g/dL (ref 1.5–4.5)
Glucose: 91 mg/dL (ref 65–99)
Potassium: 4.3 mmol/L (ref 3.5–5.2)
Sodium: 142 mmol/L (ref 134–144)
Total Protein: 8.1 g/dL (ref 6.0–8.5)

## 2019-11-21 LAB — T4, FREE: Free T4: 0.83 ng/dL (ref 0.82–1.77)

## 2019-11-21 LAB — PSA: Prostate Specific Ag, Serum: 1.1 ng/mL (ref 0.0–4.0)

## 2019-11-21 LAB — TSH: TSH: 1.58 u[IU]/mL (ref 0.450–4.500)

## 2019-12-22 ENCOUNTER — Other Ambulatory Visit: Payer: Self-pay

## 2019-12-22 DIAGNOSIS — K219 Gastro-esophageal reflux disease without esophagitis: Secondary | ICD-10-CM

## 2019-12-22 MED ORDER — NEXIUM 40 MG PO CPDR: 40.0000 mg | DELAYED_RELEASE_CAPSULE | Freq: Every day | ORAL | 5 refills | Status: DC

## 2020-01-12 ENCOUNTER — Ambulatory Visit: Payer: Medicaid Other | Admitting: Hospice and Palliative Medicine

## 2020-01-12 ENCOUNTER — Encounter: Payer: Self-pay | Admitting: Hospice and Palliative Medicine

## 2020-01-12 ENCOUNTER — Other Ambulatory Visit: Payer: Self-pay

## 2020-01-12 DIAGNOSIS — E782 Mixed hyperlipidemia: Secondary | ICD-10-CM

## 2020-01-12 DIAGNOSIS — Z23 Encounter for immunization: Secondary | ICD-10-CM | POA: Diagnosis not present

## 2020-01-12 DIAGNOSIS — M5442 Lumbago with sciatica, left side: Secondary | ICD-10-CM | POA: Diagnosis not present

## 2020-01-12 DIAGNOSIS — E1165 Type 2 diabetes mellitus with hyperglycemia: Secondary | ICD-10-CM | POA: Diagnosis not present

## 2020-01-12 DIAGNOSIS — G8929 Other chronic pain: Secondary | ICD-10-CM

## 2020-01-12 DIAGNOSIS — M5441 Lumbago with sciatica, right side: Secondary | ICD-10-CM

## 2020-01-12 LAB — POCT GLYCOSYLATED HEMOGLOBIN (HGB A1C): Hemoglobin A1C: 5.6 % (ref 4.0–5.6)

## 2020-01-12 MED ORDER — GABAPENTIN 600 MG PO TABS: 600.0000 mg | ORAL_TABLET | Freq: Two times a day (BID) | ORAL | 2 refills | Status: DC

## 2020-01-12 NOTE — Patient Instructions (Signed)
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
## 2020-01-13 ENCOUNTER — Encounter: Payer: Self-pay | Admitting: Hospice and Palliative Medicine

## 2020-01-14 ENCOUNTER — Other Ambulatory Visit: Payer: Self-pay

## 2020-01-14 DIAGNOSIS — M549 Dorsalgia, unspecified: Secondary | ICD-10-CM

## 2020-01-14 MED ORDER — MELOXICAM 7.5 MG PO TABS: 7.5000 mg | ORAL_TABLET | Freq: Every day | ORAL | 6 refills | Status: DC

## 2020-02-02 ENCOUNTER — Other Ambulatory Visit: Payer: Self-pay | Admitting: Adult Health

## 2020-02-02 DIAGNOSIS — E1140 Type 2 diabetes mellitus with diabetic neuropathy, unspecified: Secondary | ICD-10-CM

## 2020-02-04 ENCOUNTER — Other Ambulatory Visit: Payer: Self-pay

## 2020-02-04 DIAGNOSIS — E1140 Type 2 diabetes mellitus with diabetic neuropathy, unspecified: Secondary | ICD-10-CM

## 2020-02-04 MED ORDER — METFORMIN HCL 500 MG PO TABS: 250.0000 mg | ORAL_TABLET | Freq: Two times a day (BID) | ORAL | 3 refills | Status: DC

## 2020-03-06 ENCOUNTER — Other Ambulatory Visit: Payer: Self-pay | Admitting: Adult Health

## 2020-03-06 DIAGNOSIS — H1013 Acute atopic conjunctivitis, bilateral: Secondary | ICD-10-CM

## 2020-03-13 ENCOUNTER — Other Ambulatory Visit: Payer: Self-pay | Admitting: Adult Health

## 2020-03-13 DIAGNOSIS — H1013 Acute atopic conjunctivitis, bilateral: Secondary | ICD-10-CM

## 2020-03-18 ENCOUNTER — Other Ambulatory Visit: Payer: Self-pay | Admitting: Adult Health

## 2020-03-18 DIAGNOSIS — I10 Essential (primary) hypertension: Secondary | ICD-10-CM

## 2020-04-08 ENCOUNTER — Other Ambulatory Visit: Payer: Self-pay | Admitting: Hospice and Palliative Medicine

## 2020-04-08 DIAGNOSIS — G8929 Other chronic pain: Secondary | ICD-10-CM

## 2020-04-12 ENCOUNTER — Ambulatory Visit: Payer: Medicaid Other | Admitting: Hospice and Palliative Medicine

## 2020-05-13 ENCOUNTER — Other Ambulatory Visit: Payer: Self-pay | Admitting: Adult Health

## 2020-05-13 ENCOUNTER — Other Ambulatory Visit: Payer: Self-pay | Admitting: Internal Medicine

## 2020-05-13 ENCOUNTER — Other Ambulatory Visit (HOSPITAL_COMMUNITY): Payer: Self-pay | Admitting: Gastroenterology

## 2020-05-13 ENCOUNTER — Other Ambulatory Visit: Payer: Self-pay | Admitting: Gastroenterology

## 2020-05-13 DIAGNOSIS — I10 Essential (primary) hypertension: Secondary | ICD-10-CM

## 2020-05-13 DIAGNOSIS — K760 Fatty (change of) liver, not elsewhere classified: Secondary | ICD-10-CM

## 2020-05-13 DIAGNOSIS — M5442 Lumbago with sciatica, left side: Secondary | ICD-10-CM

## 2020-05-13 DIAGNOSIS — G8929 Other chronic pain: Secondary | ICD-10-CM

## 2020-05-14 ENCOUNTER — Other Ambulatory Visit: Payer: Self-pay | Admitting: Internal Medicine

## 2020-05-14 DIAGNOSIS — I10 Essential (primary) hypertension: Secondary | ICD-10-CM

## 2020-05-24 ENCOUNTER — Telehealth: Payer: Self-pay

## 2020-05-24 NOTE — Telephone Encounter (Signed)
Lmom to rs missed ov from 04-12-20. Loma Sousa

## 2020-06-11 ENCOUNTER — Other Ambulatory Visit: Payer: Self-pay | Admitting: Internal Medicine

## 2020-06-11 ENCOUNTER — Other Ambulatory Visit: Payer: Self-pay | Admitting: Hospice and Palliative Medicine

## 2020-06-11 DIAGNOSIS — M5441 Lumbago with sciatica, right side: Secondary | ICD-10-CM

## 2020-06-11 DIAGNOSIS — K219 Gastro-esophageal reflux disease without esophagitis: Secondary | ICD-10-CM

## 2020-06-11 DIAGNOSIS — G8929 Other chronic pain: Secondary | ICD-10-CM

## 2020-06-11 NOTE — Telephone Encounter (Signed)
Pt needs appt for future refills 

## 2020-07-15 ENCOUNTER — Other Ambulatory Visit: Payer: Self-pay | Admitting: Internal Medicine

## 2020-07-15 DIAGNOSIS — G8929 Other chronic pain: Secondary | ICD-10-CM

## 2020-07-15 DIAGNOSIS — K219 Gastro-esophageal reflux disease without esophagitis: Secondary | ICD-10-CM

## 2020-07-20 ENCOUNTER — Ambulatory Visit: Payer: Medicaid Other | Admitting: Nurse Practitioner

## 2020-07-20 ENCOUNTER — Encounter: Payer: Self-pay | Admitting: Nurse Practitioner

## 2020-07-20 ENCOUNTER — Other Ambulatory Visit: Payer: Self-pay

## 2020-07-20 VITALS — BP 142/86 | HR 82 | Temp 98.0°F | Resp 16 | Ht 69.0 in | Wt 225.6 lb

## 2020-07-20 DIAGNOSIS — E1140 Type 2 diabetes mellitus with diabetic neuropathy, unspecified: Secondary | ICD-10-CM | POA: Diagnosis not present

## 2020-07-20 DIAGNOSIS — I10 Essential (primary) hypertension: Secondary | ICD-10-CM

## 2020-07-20 DIAGNOSIS — E782 Mixed hyperlipidemia: Secondary | ICD-10-CM

## 2020-07-20 DIAGNOSIS — G8929 Other chronic pain: Secondary | ICD-10-CM

## 2020-07-20 DIAGNOSIS — H1013 Acute atopic conjunctivitis, bilateral: Secondary | ICD-10-CM

## 2020-07-20 DIAGNOSIS — M5442 Lumbago with sciatica, left side: Secondary | ICD-10-CM | POA: Diagnosis not present

## 2020-07-20 DIAGNOSIS — M5441 Lumbago with sciatica, right side: Secondary | ICD-10-CM

## 2020-07-20 LAB — POCT GLYCOSYLATED HEMOGLOBIN (HGB A1C): Hemoglobin A1C: 5.5 % (ref 4.0–5.6)

## 2020-07-20 MED ORDER — CYCLOBENZAPRINE HCL 10 MG PO TABS: 10.0000 mg | ORAL_TABLET | Freq: Every day | ORAL | 1 refills | Status: DC

## 2020-07-20 MED ORDER — METFORMIN HCL 500 MG PO TABS: 250.0000 mg | ORAL_TABLET | Freq: Two times a day (BID) | ORAL | 3 refills | Status: DC

## 2020-07-20 MED ORDER — OLOPATADINE HCL 0.2 % OP SOLN: OPHTHALMIC | 5 refills | Status: DC

## 2020-07-20 MED ORDER — ROSUVASTATIN CALCIUM 10 MG PO TABS: 20.0000 mg | ORAL_TABLET | Freq: Every day | ORAL | 0 refills | Status: DC

## 2020-07-20 MED ORDER — FUROSEMIDE 20 MG PO TABS: 20.0000 mg | ORAL_TABLET | Freq: Every day | ORAL | 2 refills | Status: DC

## 2020-07-20 MED ORDER — LISINOPRIL 10 MG PO TABS: 2.0000 | ORAL_TABLET | Freq: Every morning | ORAL | 3 refills | Status: DC

## 2020-07-20 MED ORDER — MELOXICAM 15 MG PO TABS: 15.0000 mg | ORAL_TABLET | Freq: Every day | ORAL | 0 refills | Status: DC
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
## 2020-07-27 DIAGNOSIS — E1140 Type 2 diabetes mellitus with diabetic neuropathy, unspecified: Secondary | ICD-10-CM | POA: Insufficient documentation

## 2020-07-27 DIAGNOSIS — G8929 Other chronic pain: Secondary | ICD-10-CM | POA: Insufficient documentation

## 2020-07-27 DIAGNOSIS — E1169 Type 2 diabetes mellitus with other specified complication: Secondary | ICD-10-CM | POA: Insufficient documentation

## 2020-07-27 DIAGNOSIS — E782 Mixed hyperlipidemia: Secondary | ICD-10-CM | POA: Insufficient documentation

## 2020-07-27 DIAGNOSIS — M5441 Lumbago with sciatica, right side: Secondary | ICD-10-CM | POA: Insufficient documentation

## 2020-08-04 ENCOUNTER — Telehealth: Payer: Self-pay

## 2020-08-05 ENCOUNTER — Telehealth: Payer: Self-pay

## 2020-08-05 NOTE — Telephone Encounter (Signed)
Patient called regarding order for shoes. He stated he called yesterday and nobody called him back. He stated he has a ride tomorrow and is hoping to be able to pick up shoes. I sent message to [place] Ouch for her to call him back @ [phone]

## 2020-08-06 ENCOUNTER — Telehealth: Payer: Self-pay

## 2020-08-06 ENCOUNTER — Encounter: Payer: Medicaid Other | Attending: Gastroenterology | Admitting: Dietician

## 2020-08-06 ENCOUNTER — Encounter: Payer: Self-pay | Admitting: Dietician

## 2020-08-06 ENCOUNTER — Other Ambulatory Visit: Payer: Self-pay

## 2020-08-06 VITALS — Ht 69.0 in | Wt 227.1 lb

## 2020-08-06 DIAGNOSIS — K760 Fatty (change of) liver, not elsewhere classified: Secondary | ICD-10-CM | POA: Insufficient documentation

## 2020-08-06 NOTE — Telephone Encounter (Signed)
CMN for Diabetic shoe supply faxed to [facility] at [phone] and placed in scan. Colton Whitehead

## 2020-08-06 NOTE — Progress Notes (Signed)
Medical Nutrition Therapy: Visit start time: 0820  end time: 0930  Assessment:  Diagnosis: NAFLD Past medical history: HTN, Type 2 diabetes, hyperlipidemia, gout, other arthritis, GERD Psychosocial issues/ stress concerns: anxiety/ stress; sees psychiatrist  Preferred learning method:  Auditory-- discussion   Current weight: 227.1lbs Height: 5'9" BMI: Medications, supplements: reconciled list in medical record  Progress and evaluation:  Patient reports weight gain in recent months, likely due to increase in soda intake as well as sweet tea. He was drinking mostly water and sugar free flavored water but has drifted back to sugary beverages. He has also increased snacking including candy  He reports food stamp distribution has decreased from $250 to $116 per month.   He has no transportation at this time, so rides bicycle when able or borrows vehicle from friend. This limits ability to access other resources for foods.  Patient states he occasionally is unable to refill meds due to lack of ability to pay for them.  Physical activity: walks dog, rides bicycle several days a week for transportation  Dietary Intake:  Usual eating pattern includes 2-3 meals and 1-2 snacks per day. Dining out frequency: 0-1 meals per week.  Breakfast: ham eggs, cheese 1 biscuit with gravy; usu eggs at home Snack: none or same as pm Lunch: sometimes skips sometimes 2 hot dogs or cheese dogs, chicken salad sandwich; veg ie 3 small cucumbers or salad; loves pickles, pickle juice Snack: small chocolate bars; reeses cups; pistachios or peanuts snack packs; canned peaches or applesauce Supper: soup, can beanie weenie; bowl rice -- simple meals Snack: none or same as pm Beverages: sodas and sweet tea, recently, usu flavored waters few swallows of apple cider vinegar 1 or more times daily  Nutrition Care Education: Topics covered:  Basic nutrition: basic food groups, appropriate nutrient balance, appropriate meal  and snack schedule, general nutrition guidelines    Weight control: importance of low sugar and low fat choices, portion control/ appropriate food portions; estimated energy needs for weight loss at 1600kcal, provided guidance for 45% CHO, 25% protein, 30% fat via simple meal pattern/ plate method Advanced nutrition:  affordable food sources, low budget meal planning Fatty liver: limiting sugar and starchy foods; choosing low fat foods; structure and consistency with meals and snacks; healthy meal and snack options Diabetes:  appropriate meal and snack schedule, appropriate carb intake and balance Hypertension: identifying high sodium foods, identifying food sources of potassium, magnesium   Nutritional Diagnosis:  [place]-2.2 Altered nutrition-related laboratory As related to fatty liver.  As evidenced by elevated liver function tests, abnormal ultrasound. [place]-3.3 Overweight/obesity As related to excess calories, stress.  As evidenced by patient with current BMI of 33.54, and patient report of dietary history.  Intervention:  Instruction and discussion as noted above. Patient voices understanding of need for change and readiness for change with concrete plans for making dietary changes.  Established nutrition goals with direction from patient.  No follow up scheduled due to lack of transportation; encouraged patient to call with any questions or concerns.  Education Materials given:  Diet for fattly liver disease [employer] [facility] [place]) Plate Planner with food lists, sample meal pattern Visit summary with goals/ instructions   Learner/ who was taught:  Patient   Level of understanding: Verbalizes/ demonstrates competency   Demonstrated degree of understanding via:   Teach back Learning barriers: None  Willingness to learn/ readiness for change: Acceptance, ready for change   Monitoring and Evaluation:  Dietary intake, exercise, liver health, and body weight  follow up: prn

## 2020-08-06 NOTE — Patient Instructions (Signed)
Drink water or sugar free flavored water rather than soda or sweet tea. 1 -2 glasses of diet soda or tea with artificial sweetener. Choose lean meats like baked or grilled chicken [place] or pork tenderloin.  Eat healthy snacks like nuts and fruit, low fat cheese and fruit, low fat and sodium popcorn in small - medium portion (3-6 cups), vegetables with a small amount of dressing Eat some meals without meat but include beans for protein. Eat 1 palm-sized portion of meat at a time which is healthier and helps with food budget. Keep up regular exercise, good job!

## 2020-08-12 NOTE — Telephone Encounter (Signed)
error 

## 2020-08-13 ENCOUNTER — Other Ambulatory Visit: Payer: Self-pay | Admitting: Internal Medicine

## 2020-08-13 DIAGNOSIS — E782 Mixed hyperlipidemia: Secondary | ICD-10-CM

## 2020-08-13 DIAGNOSIS — G8929 Other chronic pain: Secondary | ICD-10-CM

## 2020-08-13 DIAGNOSIS — K219 Gastro-esophageal reflux disease without esophagitis: Secondary | ICD-10-CM

## 2020-08-13 DIAGNOSIS — M5442 Lumbago with sciatica, left side: Secondary | ICD-10-CM

## 2020-08-18 ENCOUNTER — Telehealth: Payer: Self-pay

## 2020-08-18 NOTE — Telephone Encounter (Signed)
LMOM to inform pt paperwork was faxed for his shoes

## 2020-08-18 NOTE — Telephone Encounter (Signed)
Faxed diabetic shoe paper to [facility] its confirm that they received

## 2020-08-18 NOTE — Telephone Encounter (Signed)
LMOM that we faxed paperwork for diabetic shoes

## 2020-08-19 ENCOUNTER — Telehealth: Payer: Self-pay

## 2020-08-19 NOTE — Telephone Encounter (Signed)
Spoke with [facility] they received paperwork for diabetic shoe and they will call pt and also try to call pt no voicemail setup

## 2020-09-02 ENCOUNTER — Telehealth: Payer: Self-pay

## 2020-09-02 NOTE — Telephone Encounter (Signed)
Left vm for 09/03/20 appointment screening-Toni

## 2020-09-03 ENCOUNTER — Ambulatory Visit: Payer: Medicaid Other | Admitting: Nurse Practitioner

## 2020-09-03 ENCOUNTER — Other Ambulatory Visit: Payer: Self-pay

## 2020-09-03 ENCOUNTER — Encounter: Payer: Self-pay | Admitting: Nurse Practitioner

## 2020-09-03 VITALS — BP 106/68 | HR 90 | Temp 97.7°F | Resp 16 | Ht 69.0 in | Wt 224.6 lb

## 2020-09-03 DIAGNOSIS — Z23 Encounter for immunization: Secondary | ICD-10-CM | POA: Diagnosis not present

## 2020-09-03 DIAGNOSIS — M5442 Lumbago with sciatica, left side: Secondary | ICD-10-CM | POA: Diagnosis not present

## 2020-09-03 DIAGNOSIS — G8929 Other chronic pain: Secondary | ICD-10-CM

## 2020-09-03 DIAGNOSIS — Z79899 Other long term (current) drug therapy: Secondary | ICD-10-CM

## 2020-09-03 DIAGNOSIS — M5441 Lumbago with sciatica, right side: Secondary | ICD-10-CM

## 2020-09-03 DIAGNOSIS — F411 Generalized anxiety disorder: Secondary | ICD-10-CM

## 2020-09-03 DIAGNOSIS — I10 Essential (primary) hypertension: Secondary | ICD-10-CM

## 2020-09-03 LAB — POCT URINE DRUG SCREEN
Methylenedioxyamphetamine: NOT DETECTED
POC Amphetamine UR: NOT DETECTED
POC BENZODIAZEPINES UR: POSITIVE — AB
POC Barbiturate UR: NOT DETECTED
POC Cocaine UR: NOT DETECTED
POC Ecstasy UR: NOT DETECTED
POC Marijuana UR: NOT DETECTED
POC Methadone UR: NOT DETECTED
POC Methamphetamine UR: NOT DETECTED
POC Opiate Ur: NOT DETECTED
POC Oxycodone UR: NOT DETECTED
POC PHENCYCLIDINE UR: NOT DETECTED
POC TRICYCLICS UR: POSITIVE — AB

## 2020-09-03 MED ORDER — CYCLOBENZAPRINE HCL 10 MG PO TABS: 10.0000 mg | ORAL_TABLET | Freq: Every day | ORAL | 2 refills | Status: DC

## 2020-09-03 MED ORDER — FUROSEMIDE 20 MG PO TABS
20.0000 mg | ORAL_TABLET | Freq: Every day | ORAL | 2 refills | Status: DC
Start: 2020-09-03 — End: 2021-01-06

## 2020-09-03 MED ORDER — MELOXICAM 15 MG PO TABS: 15.0000 mg | ORAL_TABLET | Freq: Every day | ORAL | 2 refills | Status: DC

## 2020-09-03 MED ORDER — PNEUMOCOCCAL 20-VAL CONJ VACC 0.5 ML IM SUSY: 0.5000 mL | PREFILLED_SYRINGE | INTRAMUSCULAR | 0 refills | Status: AC

## 2020-09-03 MED ORDER — ZOSTER VAC RECOMB ADJUVANTED 50 MCG/0.5ML IM SUSR: 0.5000 mL | Freq: Once | INTRAMUSCULAR | 0 refills | Status: AC

## 2020-09-03 MED ORDER — TETANUS-DIPHTH-ACELL PERTUSSIS 5-2.5-18.5 LF-MCG/0.5 IM SUSP: 0.5000 mL | Freq: Once | INTRAMUSCULAR | 0 refills | Status: AC

## 2020-09-03 MED ORDER — LISINOPRIL 20 MG PO TABS: 20.0000 mg | ORAL_TABLET | Freq: Every day | ORAL | 3 refills | Status: DC
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
## 2020-09-06 MED ORDER — ALPRAZOLAM 0.5 MG PO TABS: 0.5000 mg | ORAL_TABLET | Freq: Two times a day (BID) | ORAL | 0 refills | Status: DC | PRN

## 2020-09-10 ENCOUNTER — Other Ambulatory Visit: Payer: Self-pay | Admitting: Internal Medicine

## 2020-09-11 LAB — LIPID PANEL
Chol/HDL Ratio: 2.5 ratio (ref 0.0–5.0)
Cholesterol, Total: 152 mg/dL (ref 100–199)
HDL: 62 mg/dL (ref >39)
LDL Chol Calc (NIH): 74 mg/dL (ref 0–99)
Triglycerides: 87 mg/dL (ref 0–149)
VLDL Cholesterol Cal: 16 mg/dL (ref 5–40)

## 2020-09-24 ENCOUNTER — Ambulatory Visit: Payer: Medicaid Other | Admitting: Nurse Practitioner

## 2020-09-24 ENCOUNTER — Other Ambulatory Visit: Payer: Self-pay

## 2020-09-24 ENCOUNTER — Encounter: Payer: Self-pay | Admitting: Nurse Practitioner

## 2020-09-24 VITALS — BP 108/80 | HR 75 | Temp 97.8°F | Resp 16 | Ht 69.0 in | Wt 230.4 lb

## 2020-09-24 DIAGNOSIS — I10 Essential (primary) hypertension: Secondary | ICD-10-CM | POA: Diagnosis not present

## 2020-09-24 DIAGNOSIS — E1140 Type 2 diabetes mellitus with diabetic neuropathy, unspecified: Secondary | ICD-10-CM

## 2020-09-24 DIAGNOSIS — F411 Generalized anxiety disorder: Secondary | ICD-10-CM

## 2020-09-24 DIAGNOSIS — E782 Mixed hyperlipidemia: Secondary | ICD-10-CM | POA: Diagnosis not present

## 2020-09-24 DIAGNOSIS — H1013 Acute atopic conjunctivitis, bilateral: Secondary | ICD-10-CM

## 2020-09-24 MED ORDER — OLOPATADINE HCL 0.2 % OP SOLN: OPHTHALMIC | 5 refills | Status: DC

## 2020-09-24 MED ORDER — ROSUVASTATIN CALCIUM 20 MG PO TABS: 20.0000 mg | ORAL_TABLET | Freq: Every day | ORAL | 1 refills | Status: DC
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
## 2020-10-05 ENCOUNTER — Other Ambulatory Visit: Payer: Self-pay | Admitting: Internal Medicine

## 2020-10-05 DIAGNOSIS — F411 Generalized anxiety disorder: Secondary | ICD-10-CM

## 2020-10-05 NOTE — Telephone Encounter (Signed)
Pt is not due until next week

## 2020-10-06 ENCOUNTER — Other Ambulatory Visit: Payer: Self-pay | Admitting: Internal Medicine

## 2020-10-06 DIAGNOSIS — F411 Generalized anxiety disorder: Secondary | ICD-10-CM

## 2020-10-07 NOTE — Telephone Encounter (Signed)
Please review and fill if appropriate LOV: 09/24/20 NOV: 10/29/20

## 2020-10-29 ENCOUNTER — Other Ambulatory Visit: Payer: Self-pay

## 2020-10-29 ENCOUNTER — Ambulatory Visit: Payer: Medicaid Other | Admitting: Nurse Practitioner

## 2020-10-29 ENCOUNTER — Encounter: Payer: Self-pay | Admitting: Nurse Practitioner

## 2020-10-29 VITALS — BP 120/82 | HR 70 | Temp 98.6°F | Resp 16 | Ht 69.0 in | Wt 233.0 lb

## 2020-10-29 DIAGNOSIS — E1140 Type 2 diabetes mellitus with diabetic neuropathy, unspecified: Secondary | ICD-10-CM

## 2020-10-29 DIAGNOSIS — K7469 Other cirrhosis of liver: Secondary | ICD-10-CM

## 2020-10-29 DIAGNOSIS — I70213 Atherosclerosis of native arteries of extremities with intermittent claudication, bilateral legs: Secondary | ICD-10-CM

## 2020-10-29 DIAGNOSIS — I10 Essential (primary) hypertension: Secondary | ICD-10-CM

## 2020-10-29 DIAGNOSIS — Z6834 Body mass index (BMI) 34.0-34.9, adult: Secondary | ICD-10-CM

## 2020-10-29 DIAGNOSIS — E1159 Type 2 diabetes mellitus with other circulatory complications: Secondary | ICD-10-CM | POA: Diagnosis not present

## 2020-10-29 DIAGNOSIS — E782 Mixed hyperlipidemia: Secondary | ICD-10-CM | POA: Diagnosis not present

## 2020-10-29 DIAGNOSIS — Z0001 Encounter for general adult medical examination with abnormal findings: Secondary | ICD-10-CM

## 2020-10-29 DIAGNOSIS — I739 Peripheral vascular disease, unspecified: Secondary | ICD-10-CM

## 2020-10-29 DIAGNOSIS — Z23 Encounter for immunization: Secondary | ICD-10-CM

## 2020-10-29 DIAGNOSIS — E6609 Other obesity due to excess calories: Secondary | ICD-10-CM

## 2020-10-29 LAB — POCT GLYCOSYLATED HEMOGLOBIN (HGB A1C): Hemoglobin A1C: 5.7 % — AB (ref 4.0–5.6)

## 2020-10-29 MED ORDER — ROSUVASTATIN CALCIUM 20 MG PO TABS: 40.0000 mg | ORAL_TABLET | Freq: Every day | ORAL | 1 refills | Status: DC
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
## 2020-11-01 ENCOUNTER — Telehealth: Payer: Self-pay

## 2020-11-01 NOTE — Telephone Encounter (Signed)
LMOM for pt to call back.

## 2020-11-11 ENCOUNTER — Other Ambulatory Visit: Payer: Self-pay

## 2020-11-11 ENCOUNTER — Ambulatory Visit
Admission: RE | Admit: 2020-11-11 | Discharge: 2020-11-11 | Disposition: A | Discharge status: Home / Self Care | Payer: Medicaid Other | Source: Ambulatory Visit | Attending: Gastroenterology | Admitting: Gastroenterology

## 2020-11-11 DIAGNOSIS — K760 Fatty (change of) liver, not elsewhere classified: Secondary | ICD-10-CM | POA: Insufficient documentation

## 2020-11-11 DIAGNOSIS — K7469 Other cirrhosis of liver: Secondary | ICD-10-CM | POA: Insufficient documentation

## 2020-12-03 ENCOUNTER — Other Ambulatory Visit: Payer: Self-pay | Admitting: Internal Medicine

## 2020-12-03 DIAGNOSIS — G8929 Other chronic pain: Secondary | ICD-10-CM

## 2020-12-03 DIAGNOSIS — F411 Generalized anxiety disorder: Secondary | ICD-10-CM

## 2020-12-03 DIAGNOSIS — K219 Gastro-esophageal reflux disease without esophagitis: Secondary | ICD-10-CM

## 2021-01-06 ENCOUNTER — Other Ambulatory Visit: Payer: Self-pay | Admitting: Internal Medicine

## 2021-01-06 DIAGNOSIS — I10 Essential (primary) hypertension: Secondary | ICD-10-CM

## 2021-01-28 ENCOUNTER — Other Ambulatory Visit: Payer: Self-pay

## 2021-01-28 ENCOUNTER — Encounter: Payer: Self-pay | Admitting: Nurse Practitioner

## 2021-01-28 ENCOUNTER — Ambulatory Visit: Payer: Medicaid Other | Admitting: Nurse Practitioner

## 2021-01-28 VITALS — BP 116/76 | HR 93 | Temp 98.9°F | Resp 16 | Ht 69.0 in | Wt 224.8 lb

## 2021-01-28 DIAGNOSIS — G8929 Other chronic pain: Secondary | ICD-10-CM

## 2021-01-28 DIAGNOSIS — E782 Mixed hyperlipidemia: Secondary | ICD-10-CM

## 2021-01-28 DIAGNOSIS — L03115 Cellulitis of right lower limb: Secondary | ICD-10-CM | POA: Diagnosis not present

## 2021-01-28 DIAGNOSIS — E1140 Type 2 diabetes mellitus with diabetic neuropathy, unspecified: Secondary | ICD-10-CM

## 2021-01-28 DIAGNOSIS — K219 Gastro-esophageal reflux disease without esophagitis: Secondary | ICD-10-CM

## 2021-01-28 DIAGNOSIS — M5441 Lumbago with sciatica, right side: Secondary | ICD-10-CM

## 2021-01-28 DIAGNOSIS — M5442 Lumbago with sciatica, left side: Secondary | ICD-10-CM

## 2021-01-28 LAB — POCT GLYCOSYLATED HEMOGLOBIN (HGB A1C): Hemoglobin A1C: 6 % — AB (ref 4.0–5.6)

## 2021-01-28 MED ORDER — GABAPENTIN 600 MG PO TABS: 600.0000 mg | ORAL_TABLET | Freq: Two times a day (BID) | ORAL | 3 refills | Status: DC

## 2021-01-28 MED ORDER — DOXYCYCLINE HYCLATE 100 MG PO TABS: 100.0000 mg | ORAL_TABLET | Freq: Two times a day (BID) | ORAL | 0 refills | Status: DC

## 2021-01-28 MED ORDER — NEXIUM 40 MG PO CPDR: 40.0000 mg | DELAYED_RELEASE_CAPSULE | Freq: Every day | ORAL | 3 refills | Status: DC

## 2021-01-28 MED ORDER — MUPIROCIN 2 % EX OINT: 1.0000 "application " | TOPICAL_OINTMENT | Freq: Two times a day (BID) | CUTANEOUS | 2 refills | Status: DC

## 2021-01-28 MED ORDER — ROSUVASTATIN CALCIUM 20 MG PO TABS: 40.0000 mg | ORAL_TABLET | Freq: Every day | ORAL | 1 refills | Status: DC

## 2021-01-28 MED ORDER — METFORMIN HCL 500 MG PO TABS: 250.0000 mg | ORAL_TABLET | Freq: Two times a day (BID) | ORAL | 3 refills | Status: DC

## 2021-01-28 MED ORDER — GABAPENTIN 600 MG PO TABS
600.0000 mg | ORAL_TABLET | Freq: Two times a day (BID) | ORAL | 3 refills | Status: DC
Start: 2021-01-28 — End: 2021-01-28

## 2021-01-28 MED ORDER — MELOXICAM 15 MG PO TABS: 15.0000 mg | ORAL_TABLET | Freq: Every day | ORAL | 2 refills | Status: DC

## 2021-01-28 MED ORDER — MELOXICAM 15 MG PO TABS
15.0000 mg | ORAL_TABLET | Freq: Every day | ORAL | 2 refills | Status: DC
Start: 2021-01-28 — End: 2021-01-28

## 2021-01-28 MED ORDER — MUPIROCIN 2 % EX OINT
1.0000 | TOPICAL_OINTMENT | Freq: Two times a day (BID) | CUTANEOUS | 2 refills | Status: DC
Start: 2021-01-28 — End: 2021-11-02

## 2021-01-28 MED ORDER — DOXYCYCLINE HYCLATE 100 MG PO TABS
100.0000 mg | ORAL_TABLET | Freq: Two times a day (BID) | ORAL | 0 refills | Status: DC
Start: 2021-01-28 — End: 2021-02-18
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
## 2021-02-17 ENCOUNTER — Telehealth: Payer: Self-pay

## 2021-02-18 ENCOUNTER — Other Ambulatory Visit: Payer: Self-pay | Admitting: Gastroenterology

## 2021-02-18 ENCOUNTER — Telehealth: Payer: Self-pay

## 2021-02-18 DIAGNOSIS — K760 Fatty (change of) liver, not elsewhere classified: Secondary | ICD-10-CM

## 2021-02-18 MED ORDER — DOXYCYCLINE HYCLATE 100 MG PO TABS: 100.0000 mg | ORAL_TABLET | Freq: Two times a day (BID) | ORAL | 0 refills | Status: DC

## 2021-02-18 MED ORDER — CLOTRIMAZOLE-BETAMETHASONE 1-0.05 % EX CREA: 1.0000 "application " | TOPICAL_CREAM | Freq: Two times a day (BID) | CUTANEOUS | 1 refills | Status: AC

## 2021-02-18 NOTE — Telephone Encounter (Signed)
LMOM advising pt that Alyssa sent more Doxycycline and a topical lotrisone cream to his pharmacy along with instructions.  Informed pt to call back if any questions or concerns

## 2021-02-18 NOTE — Telephone Encounter (Signed)
Pt walked in office and was advised we sent prescriptions to pharmacy.

## 2021-02-22 ENCOUNTER — Telehealth: Payer: Self-pay

## 2021-02-22 NOTE — Telephone Encounter (Signed)
error 

## 2021-02-22 NOTE — Telephone Encounter (Signed)
Awaiting 01/28/21 office notes for chiropractic referral-Toni

## 2021-03-04 ENCOUNTER — Encounter: Payer: Self-pay | Admitting: Nurse Practitioner

## 2021-03-04 NOTE — Telephone Encounter (Signed)
Chiropractic referral manually faxed to Dr. Joyce Copa [phone]

## 2021-04-06 ENCOUNTER — Other Ambulatory Visit: Payer: Self-pay | Admitting: Internal Medicine

## 2021-04-06 DIAGNOSIS — I10 Essential (primary) hypertension: Secondary | ICD-10-CM

## 2021-04-26 ENCOUNTER — Encounter: Payer: Self-pay | Admitting: Internal Medicine

## 2021-04-26 ENCOUNTER — Telehealth: Payer: Self-pay

## 2021-04-26 NOTE — Telephone Encounter (Signed)
[facility] with last office note  ?

## 2021-04-29 ENCOUNTER — Other Ambulatory Visit: Payer: Self-pay

## 2021-04-29 ENCOUNTER — Encounter: Payer: Self-pay | Admitting: Nurse Practitioner

## 2021-04-29 ENCOUNTER — Ambulatory Visit: Payer: Medicaid Other | Admitting: Nurse Practitioner

## 2021-04-29 VITALS — BP 116/66 | HR 88 | Temp 98.9°F | Resp 16 | Ht 69.0 in | Wt 229.8 lb

## 2021-04-29 DIAGNOSIS — I10 Essential (primary) hypertension: Secondary | ICD-10-CM | POA: Diagnosis not present

## 2021-04-29 DIAGNOSIS — H1013 Acute atopic conjunctivitis, bilateral: Secondary | ICD-10-CM

## 2021-04-29 DIAGNOSIS — E782 Mixed hyperlipidemia: Secondary | ICD-10-CM

## 2021-04-29 DIAGNOSIS — M5441 Lumbago with sciatica, right side: Secondary | ICD-10-CM

## 2021-04-29 DIAGNOSIS — E1140 Type 2 diabetes mellitus with diabetic neuropathy, unspecified: Secondary | ICD-10-CM | POA: Diagnosis not present

## 2021-04-29 DIAGNOSIS — Z91030 Bee allergy status: Secondary | ICD-10-CM

## 2021-04-29 DIAGNOSIS — M5442 Lumbago with sciatica, left side: Secondary | ICD-10-CM

## 2021-04-29 DIAGNOSIS — K7469 Other cirrhosis of liver: Secondary | ICD-10-CM

## 2021-04-29 DIAGNOSIS — G8929 Other chronic pain: Secondary | ICD-10-CM

## 2021-04-29 LAB — POCT GLYCOSYLATED HEMOGLOBIN (HGB A1C): Hemoglobin A1C: 6.3 % — AB (ref 4.0–5.6)

## 2021-04-29 MED ORDER — EPINEPHRINE 0.3 MG/0.3ML IJ SOAJ: INTRAMUSCULAR | 1 refills | Status: DC

## 2021-04-29 MED ORDER — EPINEPHRINE 0.3 MG/0.3ML IJ SOAJ: 0.3000 mg | INTRAMUSCULAR | 1 refills | Status: DC | PRN

## 2021-04-29 MED ORDER — OLOPATADINE HCL 0.2 % OP SOLN: OPHTHALMIC | 5 refills | Status: DC

## 2021-04-29 MED ORDER — METFORMIN HCL 500 MG PO TABS: 500.0000 mg | ORAL_TABLET | Freq: Two times a day (BID) | ORAL | 3 refills | Status: DC

## 2021-04-29 MED ORDER — MELOXICAM 15 MG PO TABS: 15.0000 mg | ORAL_TABLET | Freq: Every day | ORAL | 2 refills | Status: DC

## 2021-04-29 MED ORDER — GABAPENTIN 600 MG PO TABS: 600.0000 mg | ORAL_TABLET | Freq: Two times a day (BID) | ORAL | 3 refills | Status: DC
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
## 2021-05-02 ENCOUNTER — Telehealth: Payer: Self-pay

## 2021-05-02 NOTE — Telephone Encounter (Signed)
Received call from patient stating Colton Whitehead did not receive shoe order. I called their office, they stated their fax machine had not been working and had to get new one. I faxed shoe order and office notes to them again [phone] ?

## 2021-05-02 NOTE — Telephone Encounter (Signed)
Kept getting "communication error" when faxing shoe order and notes. Emailed to [email or web address] ?

## 2021-05-04 ENCOUNTER — Telehealth: Payer: Self-pay

## 2021-05-04 NOTE — Telephone Encounter (Signed)
Patient called regarding diabetic shoe order from [facility]. Notified patient that he can not receive his shoes until July of 2023, per his insurance policies.  ?

## 2021-06-28 ENCOUNTER — Other Ambulatory Visit: Payer: Self-pay | Admitting: Internal Medicine

## 2021-06-28 DIAGNOSIS — I10 Essential (primary) hypertension: Secondary | ICD-10-CM

## 2021-07-25 ENCOUNTER — Ambulatory Visit: Payer: Medicaid Other | Admitting: Anesthesiology

## 2021-07-25 ENCOUNTER — Ambulatory Visit
Admission: RE | Admit: 2021-07-25 | Discharge: 2021-07-25 | Disposition: A | Discharge status: Home / Self Care | Payer: Medicaid Other | Source: Ambulatory Visit | Attending: Gastroenterology | Admitting: Gastroenterology

## 2021-07-25 ENCOUNTER — Encounter
Admission: RE | Disposition: A | Discharge status: Home / Self Care | Payer: Self-pay | Source: Ambulatory Visit | Attending: Gastroenterology

## 2021-07-25 ENCOUNTER — Encounter: Payer: Self-pay | Admitting: *Deleted

## 2021-07-25 DIAGNOSIS — E7800 Pure hypercholesterolemia, unspecified: Secondary | ICD-10-CM | POA: Diagnosis not present

## 2021-07-25 DIAGNOSIS — M1990 Unspecified osteoarthritis, unspecified site: Secondary | ICD-10-CM | POA: Diagnosis not present

## 2021-07-25 DIAGNOSIS — E669 Obesity, unspecified: Secondary | ICD-10-CM | POA: Diagnosis not present

## 2021-07-25 DIAGNOSIS — K769 Liver disease, unspecified: Secondary | ICD-10-CM | POA: Insufficient documentation

## 2021-07-25 DIAGNOSIS — K219 Gastro-esophageal reflux disease without esophagitis: Secondary | ICD-10-CM | POA: Diagnosis not present

## 2021-07-25 DIAGNOSIS — Z6835 Body mass index (BMI) 35.0-35.9, adult: Secondary | ICD-10-CM | POA: Insufficient documentation

## 2021-07-25 DIAGNOSIS — Z87891 Personal history of nicotine dependence: Secondary | ICD-10-CM | POA: Diagnosis not present

## 2021-07-25 DIAGNOSIS — I10 Essential (primary) hypertension: Secondary | ICD-10-CM | POA: Diagnosis not present

## 2021-07-25 DIAGNOSIS — Z86010 Personal history of colonic polyps: Secondary | ICD-10-CM | POA: Diagnosis present

## 2021-07-25 DIAGNOSIS — E119 Type 2 diabetes mellitus without complications: Secondary | ICD-10-CM | POA: Diagnosis not present

## 2021-07-25 DIAGNOSIS — K640 First degree hemorrhoids: Secondary | ICD-10-CM | POA: Diagnosis not present

## 2021-07-25 DIAGNOSIS — Z79899 Other long term (current) drug therapy: Secondary | ICD-10-CM | POA: Diagnosis not present

## 2021-07-25 DIAGNOSIS — Z1211 Encounter for screening for malignant neoplasm of colon: Secondary | ICD-10-CM | POA: Diagnosis not present

## 2021-07-25 DIAGNOSIS — G709 Myoneural disorder, unspecified: Secondary | ICD-10-CM | POA: Insufficient documentation

## 2021-07-25 DIAGNOSIS — D123 Benign neoplasm of transverse colon: Secondary | ICD-10-CM | POA: Insufficient documentation

## 2021-07-25 DIAGNOSIS — Z7984 Long term (current) use of oral hypoglycemic drugs: Secondary | ICD-10-CM | POA: Diagnosis not present

## 2021-07-25 HISTORY — PX: COLONOSCOPY WITH PROPOFOL: SHX5780

## 2021-07-25 LAB — GLUCOSE, CAPILLARY: Glucose-Capillary: 132 mg/dL — ABNORMAL HIGH (ref 70–99)

## 2021-07-25 SURGERY — COLONOSCOPY WITH PROPOFOL
Anesthesia: General

## 2021-07-25 MED ORDER — EPHEDRINE SULFATE (PRESSORS) 50 MG/ML IJ SOLN
INTRAMUSCULAR | Status: DC | PRN
  Administered 2021-07-25: 5 mg via INTRAVENOUS

## 2021-07-25 MED ORDER — EPHEDRINE 5 MG/ML INJ
INTRAVENOUS | Status: AC
  Filled 2021-07-25: qty 5

## 2021-07-25 MED ORDER — PROPOFOL 500 MG/50ML IV EMUL
INTRAVENOUS | Status: DC | PRN
  Administered 2021-07-25: 150 ug/kg/min via INTRAVENOUS

## 2021-07-25 MED ORDER — PROPOFOL 1000 MG/100ML IV EMUL
INTRAVENOUS | Status: AC
  Filled 2021-07-25: qty 100

## 2021-07-25 MED ORDER — SODIUM CHLORIDE 0.9 % IV SOLN: INTRAVENOUS | Status: DC

## 2021-07-25 NOTE — Anesthesia Postprocedure Evaluation (Signed)
Anesthesia Post Note  Patient: Colton Whitehead.  Procedure(s) Performed: COLONOSCOPY WITH PROPOFOL  Patient location during evaluation: PACU Anesthesia Type: General Level of consciousness: awake and alert Pain management: pain level controlled Vital Signs Assessment: post-procedure vital signs reviewed and stable Respiratory status: spontaneous breathing, nonlabored ventilation, respiratory function stable and patient connected to nasal cannula oxygen Cardiovascular status: blood pressure returned to baseline and stable Postop Assessment: no apparent nausea or vomiting Anesthetic complications: no   No notable events documented.   Last Vitals:  Vitals:   07/25/21 0704 07/25/21 0814  BP: 122/79 (!) 92/48  Pulse: 82   Resp: 20   Temp: (!) 36.3 C (!) 36.2 C  SpO2: 97%     Last Pain:  Vitals:   07/25/21 0814  TempSrc: Temporal  PainSc: [name]

## 2021-07-25 NOTE — Transfer of Care (Signed)
Immediate Anesthesia Transfer of Care Note  Patient: Colton Whitehead.  Procedure(s) Performed: COLONOSCOPY WITH PROPOFOL  Patient Location: PACU  Anesthesia Type:General  Level of Consciousness: awake and sedated  Airway & Oxygen Therapy: Patient Spontanous Breathing and Patient connected to face mask oxygen  Post-op Assessment: Report given to RN and Post -op Vital signs reviewed and stable  Post vital signs: Reviewed and stable  Last Vitals:  Vitals Value Taken Time  BP    Temp    Pulse    Resp    SpO2      Last Pain:  Vitals:   07/25/21 0704  TempSrc: Temporal         Complications: No notable events documented.

## 2021-07-25 NOTE — Interval H&P Note (Signed)
History and Physical Interval Note:  07/25/2021 7:48 AM  Colton Whitehead.  has presented today for surgery, with the diagnosis of HX OF ADENOMATOUS POLYP OF COLON.  The various methods of treatment have been discussed with the patient and family. After consideration of risks, benefits and other options for treatment, the patient has consented to  Procedure(s): COLONOSCOPY WITH PROPOFOL (N/A) as a surgical intervention.  The patient's history has been reviewed, patient examined, no change in status, stable for surgery.  I have reviewed the patient's chart and labs.  Questions were answered to the patient's satisfaction.     Lesly Rubenstein  Ok to proceed with colonoscopy

## 2021-07-25 NOTE — H&P (Signed)
Outpatient short stay form Pre-procedure 07/25/2021  Lesly Rubenstein, MD  Primary Physician: Jonetta Osgood, NP  Reason for visit:  Surveillance colonoscopy  History of present illness:    61 y/o gentleman with history of Hep c with SVR, hypertension, and obesity here for surveillance colonoscopy. Last colonoscopy in 2017 was normal. Had polyp on colonoscopy in 2006. No blood thinners. No abdominal surgeries. No family history of GI malignancies.    Current Facility-Administered Medications:    0.9 %  sodium chloride infusion, , Intravenous, Continuous, Erol Flanagin, Hilton Cork, MD, Last Rate: 20 mL/hr at 07/25/21 0742, Continued from Pre-op at 07/25/21 0742  Medications Prior to Admission  Medication Sig Dispense Refill Last Dose   ALPRAZolam (XANAX) 0.5 MG tablet TAKE 1 TABLET BY MOUTH TWICE A DAY AS NEEDED FOR ANXIETY 60 tablet 0 07/24/2021   amLODipine (NORVASC) 10 MG tablet Take 5 mg by mouth daily.   07/24/2021   atorvastatin (LIPITOR) 20 MG tablet Take 20 mg by mouth daily.   07/24/2021   baclofen (LIORESAL) 10 MG tablet Take 10 mg by mouth 3 (three) times daily.      celecoxib (CELEBREX) 200 MG capsule Take 200 mg by mouth daily.      ergocalciferol (VITAMIN D2) 1.25 MG (50000 UT) capsule Take 50,000 Units by mouth once a week.      gabapentin (NEURONTIN) 600 MG tablet Take 1 tablet (600 mg total) by mouth 2 (two) times daily. 60 tablet 3 07/24/2021   ibuprofen (ADVIL) 400 MG tablet Take 400 mg by mouth every 6 (six) hours as needed for moderate pain.      lisinopril (ZESTRIL) 20 MG tablet TAKE 1 TABLET BY MOUTH ONCE A DAY 90 tablet 3 07/24/2021   meloxicam (MOBIC) 15 MG tablet Take 1 tablet (15 mg total) by mouth daily. 30 tablet 2 07/24/2021   metFORMIN (GLUCOPHAGE) 500 MG tablet Take 1 tablet (500 mg total) by mouth 2 (two) times daily with a meal. 60 tablet 3 07/24/2021   NEXIUM 40 MG capsule Take 1 capsule (40 mg total) by mouth daily. 30 capsule 3 07/24/2021   rosuvastatin  (CRESTOR) 20 MG tablet Take 2 tablets (40 mg total) by mouth daily. 90 tablet 1 07/24/2021   Alcohol Swabs (B-D SINGLE USE SWABS REGULAR) PADS Use as directed twice a daily E11.65 100 each 3    amitriptyline (ELAVIL) 25 MG tablet Take 25 mg by mouth at bedtime.      chlorhexidine (PERIDEX) 0.12 % solution Use as directed 15 mLs in the mouth or throat 2 (two) times daily.      cholecalciferol (VITAMIN D3) 25 MCG (1000 UT) tablet Take 1,000 Units by mouth daily.      clotrimazole-betamethasone (LOTRISONE) cream Apply 1 application topically 2 (two) times daily. To affected area until redness and itching have resolved. 45 g 1    cyclobenzaprine (FLEXERIL) 10 MG tablet Take 1 tablet (10 mg total) by mouth at bedtime. 30 tablet 2    EPINEPHrine (EPIPEN 2-PAK) 0.3 mg/0.3 mL IJ SOAJ injection Inject 0.3 mg into the muscle as needed for anaphylaxis. EpiPen 2-Pak 0.3 mg/0.3 mL injection, auto-injector 1 each 1    furosemide (LASIX) 20 MG tablet TAKE 1 TABLET BY MOUTH ONCE A DAY 30 tablet 2    glucose blood (ACCU-CHEK GUIDE) test strip Use as instructed twice daily diag E11.65 100 each 3    mupirocin ointment (BACTROBAN) 2 % Apply 1 application topically 2 (two) times daily. 30 g 2  Olopatadine HCl (PATADAY) 0.2 % SOLN Use 1 drop in both eyes once daily when needed 2.5 mL 5      Allergies  Allergen Reactions   Venomil Honey Bee Venom [Honey Bee Venom] Anaphylaxis   Escitalopram Oxalate Nausea And Vomiting   Naproxen Other (See Comments)    AS PER PT AS PER PT     Past Medical History:  Diagnosis Date   ALT (SGPT) level raised    Arthritis    Diabetes mellitus without complication ([facility])    Elevated cholesterol    GERD (gastroesophageal reflux disease)    Hypertension    Liver disease     Review of systems:  Otherwise negative.    Physical Exam  Gen: Alert, oriented. Appears stated age.  HEENT: PERRLA. Lungs: No respiratory distress CV: RRR Abd: soft, benign, no masses Ext: No  edema    Planned procedures: Proceed with colonoscopy. The patient understands the nature of the planned procedure, indications, risks, alternatives and potential complications including but not limited to bleeding, infection, perforation, damage to internal organs and possible oversedation/side effects from anesthesia. The patient agrees and gives consent to proceed.  Please refer to procedure notes for findings, recommendations and patient disposition/instructions.     Lesly Rubenstein, MD [facility] Gastroenterology

## 2021-07-25 NOTE — Anesthesia Procedure Notes (Signed)
Date/Time: 07/25/2021 7:59 AM  Performed by: Donalda Ewings, CindyPre-anesthesia Checklist: Patient identified, Emergency Drugs available, Suction available, Patient being monitored and Timeout performed Patient Re-evaluated:Patient Re-evaluated prior to induction Oxygen Delivery Method: Supernova nasal CPAP Preoxygenation: Pre-oxygenation with 100% oxygen Induction Type: IV induction Placement Confirmation: positive ETCO2 and CO2 detector

## 2021-07-25 NOTE — Op Note (Signed)
[facility]enter[facility] Patient Name: Isacc Turney Procedure Date: 07/25/2021 7:49 AM MRN: [id number] Account #: [id number] Date of Birth: 07-Nov-1960 Admit Type: Outpatient Age: 61 Room: [facility] ENDO ROOM 3 Gender: Male Note Status: Finalized Instrument Name: Jasper Riling [id number] Procedure:             Colonoscopy Indications:           High risk colon cancer surveillance: Personal history                         of non-advanced adenoma, Last colonoscopy 5 years ago Providers:             Andrey Farmer MD, MD Referring MD:          Jonetta Osgood (Referring MD) Medicines:             Monitored Anesthesia Care Complications:         No immediate complications. Estimated blood loss:                         Minimal. Procedure:             Pre-Anesthesia Assessment:                        - Prior to the procedure, a History and Physical was                         performed, and patient medications and allergies were                         reviewed. The patient is competent. The risks and                         benefits of the procedure and the sedation options and                         risks were discussed with the patient. All questions                         were answered and informed consent was obtained.                         Patient identification and proposed procedure were                         verified by the physician, the nurse, the                         anesthesiologist, the anesthetist and the technician                         in the endoscopy suite. Mental Status Examination:                         alert and oriented. Airway Examination: normal                         oropharyngeal airway and neck mobility. Respiratory  Examination: clear to auscultation. CV Examination:                         normal. Prophylactic Antibiotics: The patient does not                         require prophylactic antibiotics. Prior                          Anticoagulants: The patient has taken no previous                         anticoagulant or antiplatelet agents. ASA Grade                         Assessment: III - A patient with severe systemic                         disease. After reviewing the risks and benefits, the                         patient was deemed in satisfactory condition to                         undergo the procedure. The anesthesia plan was to use                         monitored anesthesia care (MAC). Immediately prior to                         administration of medications, the patient was                         re-assessed for adequacy to receive sedatives. The                         heart rate, respiratory rate, oxygen saturations,                         blood pressure, adequacy of pulmonary ventilation, and                         response to care were monitored throughout the                         procedure. The physical status of the patient was                         re-assessed after the procedure.                        After obtaining informed consent, the colonoscope was                         passed under direct vision. Throughout the procedure,                         the patient's blood pressure, pulse, and oxygen  saturations were monitored continuously. The                         Colonoscope was introduced through the anus and                         advanced to the the cecum, identified by appendiceal                         orifice and ileocecal valve. The colonoscopy was                         performed without difficulty. The patient tolerated                         the procedure well. The quality of the bowel                         preparation was adequate to identify polyps. Findings:      The perianal and digital rectal examinations were normal.      A 2 mm polyp was found in the transverse colon. The polyp was sessile.       The polyp  was removed with a jumbo cold forceps. Resection and retrieval       were complete. Estimated blood loss was minimal.      Internal hemorrhoids were found during retroflexion. The hemorrhoids       were Grade I (internal hemorrhoids that do not prolapse).      The exam was otherwise without abnormality on direct and retroflexion       views. Impression:            - One 2 mm polyp in the transverse colon, removed with                         a jumbo cold forceps. Resected and retrieved.                        - Internal hemorrhoids.                        - The examination was otherwise normal on direct and                         retroflexion views. Recommendation:        - Discharge patient to home.                        - Resume previous diet.                        - Continue present medications.                        - Await pathology results.                        - Repeat colonoscopy in 7 years for surveillance.                        - Return to referring physician as previously  scheduled. Procedure Code(s):     --- Professional ---                        [phone], Colonoscopy, flexible; with biopsy, single or                         multiple Diagnosis Code(s):     --- Professional ---                        Z86.010, Personal history of colonic polyps                        K63.5, Polyp of colon                        K64.0, First degree hemorrhoids CPT copyright 2019 [employer]. All rights reserved. The codes documented in this report are preliminary and upon coder review may  be revised to meet current compliance requirements. Andrey Farmer MD, MD 07/25/2021 8:13:47 AM Number of Addenda: 0 Note Initiated On: 07/25/2021 7:49 AM Scope Withdrawal Time: 0 hours 9 minutes 55 seconds  Total Procedure Duration: 0 hours 14 minutes 10 seconds  Estimated Blood Loss:  Estimated blood loss was minimal.      [facility]
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
## 2021-07-26 ENCOUNTER — Encounter: Payer: Self-pay | Admitting: Gastroenterology

## 2021-07-27 LAB — SURGICAL PATHOLOGY

## 2021-07-28 ENCOUNTER — Other Ambulatory Visit: Payer: Self-pay | Admitting: Nurse Practitioner

## 2021-07-28 DIAGNOSIS — G8929 Other chronic pain: Secondary | ICD-10-CM

## 2021-07-28 DIAGNOSIS — E782 Mixed hyperlipidemia: Secondary | ICD-10-CM

## 2021-07-29 ENCOUNTER — Encounter: Payer: Self-pay | Admitting: Nurse Practitioner

## 2021-07-29 ENCOUNTER — Other Ambulatory Visit
Admission: RE | Admit: 2021-07-29 | Discharge: 2021-07-29 | Disposition: A | Discharge status: Home / Self Care | Payer: Medicaid Other | Attending: Nurse Practitioner | Admitting: Nurse Practitioner

## 2021-07-29 ENCOUNTER — Ambulatory Visit: Payer: Medicaid Other | Admitting: Nurse Practitioner

## 2021-07-29 VITALS — BP 114/71 | HR 75 | Temp 98.8°F | Resp 16 | Ht 69.0 in | Wt 235.4 lb

## 2021-07-29 DIAGNOSIS — G8929 Other chronic pain: Secondary | ICD-10-CM

## 2021-07-29 DIAGNOSIS — E559 Vitamin D deficiency, unspecified: Secondary | ICD-10-CM | POA: Diagnosis present

## 2021-07-29 DIAGNOSIS — E1140 Type 2 diabetes mellitus with diabetic neuropathy, unspecified: Secondary | ICD-10-CM

## 2021-07-29 DIAGNOSIS — I10 Essential (primary) hypertension: Secondary | ICD-10-CM | POA: Diagnosis not present

## 2021-07-29 DIAGNOSIS — M5442 Lumbago with sciatica, left side: Secondary | ICD-10-CM | POA: Diagnosis not present

## 2021-07-29 DIAGNOSIS — H1013 Acute atopic conjunctivitis, bilateral: Secondary | ICD-10-CM

## 2021-07-29 DIAGNOSIS — M5441 Lumbago with sciatica, right side: Secondary | ICD-10-CM

## 2021-07-29 LAB — POCT GLYCOSYLATED HEMOGLOBIN (HGB A1C): Hemoglobin A1C: 6.6 % — AB (ref 4.0–5.6)

## 2021-07-29 LAB — VITAMIN D 25 HYDROXY (VIT D DEFICIENCY, FRACTURES): Vit D, 25-Hydroxy: 76.18 ng/mL (ref 30–100)

## 2021-07-29 MED ORDER — METFORMIN HCL 500 MG PO TABS: 500.0000 mg | ORAL_TABLET | Freq: Two times a day (BID) | ORAL | 3 refills | Status: DC

## 2021-07-29 MED ORDER — FUROSEMIDE 20 MG PO TABS: 20.0000 mg | ORAL_TABLET | Freq: Every day | ORAL | 2 refills | Status: DC

## 2021-07-29 MED ORDER — NOVA SAFETY LANCETS 28G MISC: 6 refills | Status: DC

## 2021-07-29 MED ORDER — OLOPATADINE HCL 0.2 % OP SOLN: OPHTHALMIC | 5 refills | Status: DC

## 2021-07-29 MED ORDER — GABAPENTIN 600 MG PO TABS: 600.0000 mg | ORAL_TABLET | Freq: Two times a day (BID) | ORAL | 3 refills | Status: DC

## 2021-08-08 ENCOUNTER — Telehealth: Payer: Self-pay

## 2021-08-08 NOTE — Telephone Encounter (Signed)
Pt advised that vitamin D is normal range as per alyssa continue OTC vitamin d 1000 IU

## 2021-08-15 ENCOUNTER — Telehealth: Payer: Self-pay

## 2021-08-15 NOTE — Telephone Encounter (Signed)
Nimisha faxed form for [employer] Fax: [phone]

## 2021-08-23 ENCOUNTER — Other Ambulatory Visit: Payer: Self-pay | Admitting: Gastroenterology

## 2021-08-23 DIAGNOSIS — K760 Fatty (change of) liver, not elsewhere classified: Secondary | ICD-10-CM

## 2021-10-13 ENCOUNTER — Ambulatory Visit: Payer: Medicaid Other | Admitting: Nurse Practitioner

## 2021-10-26 ENCOUNTER — Telehealth: Payer: Self-pay | Admitting: Nurse Practitioner

## 2021-10-26 NOTE — Telephone Encounter (Signed)
Lvm to confirm 11/02/21 appointment-Toni

## 2021-10-28 ENCOUNTER — Ambulatory Visit: Payer: Medicaid Other | Admitting: Nurse Practitioner

## 2021-11-02 ENCOUNTER — Encounter: Payer: Self-pay | Admitting: Nurse Practitioner

## 2021-11-02 ENCOUNTER — Ambulatory Visit (INDEPENDENT_AMBULATORY_CARE_PROVIDER_SITE_OTHER): Payer: Medicaid Other | Admitting: Nurse Practitioner

## 2021-11-02 VITALS — BP 136/86 | HR 88 | Temp 97.9°F | Resp 16 | Ht 69.0 in | Wt 227.2 lb

## 2021-11-02 DIAGNOSIS — Z760 Encounter for issue of repeat prescription: Secondary | ICD-10-CM

## 2021-11-02 DIAGNOSIS — E66811 Obesity, class 1: Secondary | ICD-10-CM

## 2021-11-02 DIAGNOSIS — E1140 Type 2 diabetes mellitus with diabetic neuropathy, unspecified: Secondary | ICD-10-CM | POA: Diagnosis not present

## 2021-11-02 DIAGNOSIS — Z6833 Body mass index (BMI) 33.0-33.9, adult: Secondary | ICD-10-CM

## 2021-11-02 DIAGNOSIS — E782 Mixed hyperlipidemia: Secondary | ICD-10-CM | POA: Diagnosis not present

## 2021-11-02 DIAGNOSIS — E6609 Other obesity due to excess calories: Secondary | ICD-10-CM

## 2021-11-02 DIAGNOSIS — E1169 Type 2 diabetes mellitus with other specified complication: Secondary | ICD-10-CM

## 2021-11-02 DIAGNOSIS — E559 Vitamin D deficiency, unspecified: Secondary | ICD-10-CM

## 2021-11-02 DIAGNOSIS — Z125 Encounter for screening for malignant neoplasm of prostate: Secondary | ICD-10-CM

## 2021-11-02 DIAGNOSIS — Z0001 Encounter for general adult medical examination with abnormal findings: Secondary | ICD-10-CM

## 2021-11-02 DIAGNOSIS — Z23 Encounter for immunization: Secondary | ICD-10-CM

## 2021-11-02 DIAGNOSIS — K219 Gastro-esophageal reflux disease without esophagitis: Secondary | ICD-10-CM

## 2021-11-02 DIAGNOSIS — B351 Tinea unguium: Secondary | ICD-10-CM

## 2021-11-02 DIAGNOSIS — R300 Dysuria: Secondary | ICD-10-CM

## 2021-11-02 LAB — POCT GLYCOSYLATED HEMOGLOBIN (HGB A1C): Hemoglobin A1C: 6 % — AB (ref 4.0–5.6)

## 2021-11-02 MED ORDER — GABAPENTIN 600 MG PO TABS: 600.0000 mg | ORAL_TABLET | Freq: Two times a day (BID) | ORAL | 3 refills | Status: DC

## 2021-11-02 MED ORDER — MELOXICAM 15 MG PO TABS: 15.0000 mg | ORAL_TABLET | Freq: Every day | ORAL | 2 refills | Status: DC

## 2021-11-02 MED ORDER — ROSUVASTATIN CALCIUM 20 MG PO TABS: 20.0000 mg | ORAL_TABLET | Freq: Every day | ORAL | 1 refills | Status: DC

## 2021-11-02 MED ORDER — CYCLOBENZAPRINE HCL 10 MG PO TABS: 10.0000 mg | ORAL_TABLET | Freq: Every day | ORAL | 2 refills | Status: DC

## 2021-11-02 MED ORDER — FUROSEMIDE 20 MG PO TABS: 20.0000 mg | ORAL_TABLET | Freq: Every day | ORAL | 2 refills | Status: DC

## 2021-11-02 MED ORDER — CICLOPIROX 8 % EX SOLN: Freq: Every day | CUTANEOUS | 2 refills | Status: AC

## 2021-11-02 MED ORDER — MUPIROCIN 2 % EX OINT: 1.0000 | TOPICAL_OINTMENT | Freq: Two times a day (BID) | CUTANEOUS | 2 refills | Status: AC

## 2021-11-02 MED ORDER — METFORMIN HCL 500 MG PO TABS: 500.0000 mg | ORAL_TABLET | Freq: Two times a day (BID) | ORAL | 3 refills | Status: DC

## 2021-11-02 MED ORDER — PANTOPRAZOLE SODIUM 40 MG PO TBEC: 40.0000 mg | DELAYED_RELEASE_TABLET | Freq: Every day | ORAL | 2 refills | Status: DC

## 2021-11-02 MED ORDER — EPINEPHRINE 0.3 MG/0.3ML IJ SOAJ: 0.3000 mg | INTRAMUSCULAR | 1 refills | Status: DC | PRN
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
## 2021-11-03 LAB — UA/M W/RFLX CULTURE, ROUTINE
Bilirubin, UA: NEGATIVE
Glucose, UA: NEGATIVE
Ketones, UA: NEGATIVE
Leukocytes,UA: NEGATIVE
Nitrite, UA: NEGATIVE
Protein,UA: NEGATIVE
RBC, UA: NEGATIVE
Specific Gravity, UA: 1.009 (ref 1.005–1.030)
Urobilinogen, Ur: 0.2 mg/dL (ref 0.2–1.0)
pH, UA: 5 (ref 5.0–7.5)

## 2021-11-03 LAB — MICROSCOPIC EXAMINATION
Bacteria, UA: NONE SEEN
Casts: NONE SEEN /lpf
Epithelial Cells (non renal): NONE SEEN /hpf (ref 0–10)
RBC, Urine: NONE SEEN /hpf (ref 0–2)
WBC, UA: NONE SEEN /hpf (ref 0–5)

## 2021-11-06 LAB — CBC WITH DIFFERENTIAL/PLATELET
Basophils Absolute: 0.1 10*3/uL (ref 0.0–0.2)
Basos: 1 %
EOS (ABSOLUTE): 0.2 10*3/uL (ref 0.0–0.4)
Eos: 2 %
Hematocrit: 45.6 % (ref 37.5–51.0)
Hemoglobin: 15.2 g/dL (ref 13.0–17.7)
Immature Grans (Abs): 0 10*3/uL (ref 0.0–0.1)
Immature Granulocytes: 0 %
Lymphocytes Absolute: 2.1 10*3/uL (ref 0.7–3.1)
Lymphs: 22 %
MCH: 30.3 pg (ref 26.6–33.0)
MCHC: 33.3 g/dL (ref 31.5–35.7)
MCV: 91 fL (ref 79–97)
Monocytes Absolute: 0.6 10*3/uL (ref 0.1–0.9)
Monocytes: 7 %
Neutrophils Absolute: 6.3 10*3/uL (ref 1.4–7.0)
Neutrophils: 68 %
Platelets: 167 10*3/uL (ref 150–450)
RBC: 5.02 x10E6/uL (ref 4.14–5.80)
RDW: 12.3 % (ref 11.6–15.4)
WBC: 9.3 10*3/uL (ref 3.4–10.8)

## 2021-11-06 LAB — CMP14+EGFR
ALT: 38 IU/L (ref 0–44)
AST: 33 IU/L (ref 0–40)
Albumin/Globulin Ratio: 1.8 (ref 1.2–2.2)
Albumin: 4.9 g/dL (ref 3.9–4.9)
Alkaline Phosphatase: 89 IU/L (ref 44–121)
BUN/Creatinine Ratio: 15 (ref 10–24)
BUN: 16 mg/dL (ref 8–27)
Bilirubin Total: 0.5 mg/dL (ref 0.0–1.2)
CO2: 21 mmol/L (ref 20–29)
Calcium: 9.5 mg/dL (ref 8.6–10.2)
Chloride: 99 mmol/L (ref 96–106)
Creatinine, Ser: 1.06 mg/dL (ref 0.76–1.27)
Globulin, Total: 2.8 g/dL (ref 1.5–4.5)
Glucose: 110 mg/dL — ABNORMAL HIGH (ref 70–99)
Potassium: 4.2 mmol/L (ref 3.5–5.2)
Sodium: 138 mmol/L (ref 134–144)
Total Protein: 7.7 g/dL (ref 6.0–8.5)
eGFR: 80 mL/min/{1.73_m2} (ref >59)

## 2021-11-06 LAB — B12 AND FOLATE PANEL
Folate: 8.4 ng/mL (ref >3.0)
Vitamin B-12: 445 pg/mL (ref 232–1245)

## 2021-11-06 LAB — LIPID PANEL
Chol/HDL Ratio: 2.6 ratio (ref 0.0–5.0)
Cholesterol, Total: 140 mg/dL (ref 100–199)
HDL: 53 mg/dL (ref >39)
LDL Chol Calc (NIH): 69 mg/dL (ref 0–99)
Triglycerides: 99 mg/dL (ref 0–149)
VLDL Cholesterol Cal: 18 mg/dL (ref 5–40)

## 2021-11-06 LAB — PSA TOTAL (REFLEX TO FREE): Prostate Specific Ag, Serum: 0.5 ng/mL (ref 0.0–4.0)

## 2021-11-06 LAB — TSH+FREE T4
Free T4: 1.14 ng/dL (ref 0.82–1.77)
TSH: 1.1 u[IU]/mL (ref 0.450–4.500)

## 2021-11-06 LAB — VITAMIN D 25 HYDROXY (VIT D DEFICIENCY, FRACTURES): Vit D, 25-Hydroxy: 81 ng/mL (ref 30.0–100.0)

## 2022-01-27 ENCOUNTER — Other Ambulatory Visit: Payer: Self-pay | Admitting: Nurse Practitioner

## 2022-01-27 DIAGNOSIS — Z760 Encounter for issue of repeat prescription: Secondary | ICD-10-CM

## 2022-01-27 DIAGNOSIS — K219 Gastro-esophageal reflux disease without esophagitis: Secondary | ICD-10-CM

## 2022-02-01 ENCOUNTER — Ambulatory Visit: Payer: Medicaid Other | Admitting: Nurse Practitioner

## 2022-02-01 ENCOUNTER — Other Ambulatory Visit: Payer: Self-pay

## 2022-02-01 DIAGNOSIS — E1140 Type 2 diabetes mellitus with diabetic neuropathy, unspecified: Secondary | ICD-10-CM

## 2022-02-01 DIAGNOSIS — E782 Mixed hyperlipidemia: Secondary | ICD-10-CM

## 2022-02-01 DIAGNOSIS — K219 Gastro-esophageal reflux disease without esophagitis: Secondary | ICD-10-CM

## 2022-02-01 DIAGNOSIS — Z760 Encounter for issue of repeat prescription: Secondary | ICD-10-CM

## 2022-02-01 MED ORDER — METFORMIN HCL 500 MG PO TABS: 500.0000 mg | ORAL_TABLET | Freq: Two times a day (BID) | ORAL | 3 refills | Status: DC

## 2022-02-01 MED ORDER — ROSUVASTATIN CALCIUM 20 MG PO TABS: 20.0000 mg | ORAL_TABLET | Freq: Every day | ORAL | 1 refills | Status: DC

## 2022-02-01 MED ORDER — CYCLOBENZAPRINE HCL 10 MG PO TABS: 10.0000 mg | ORAL_TABLET | Freq: Every day | ORAL | 2 refills | Status: DC

## 2022-02-01 MED ORDER — PANTOPRAZOLE SODIUM 40 MG PO TBEC: 40.0000 mg | DELAYED_RELEASE_TABLET | Freq: Every day | ORAL | 2 refills | Status: DC

## 2022-02-01 MED ORDER — FUROSEMIDE 20 MG PO TABS: 20.0000 mg | ORAL_TABLET | Freq: Every day | ORAL | 2 refills | Status: DC

## 2022-02-09 ENCOUNTER — Telehealth: Payer: Self-pay

## 2022-02-10 NOTE — Telephone Encounter (Signed)
Colton Whitehead talk to pt pt crestor 20 mg 1 tab po daily

## 2022-02-27 ENCOUNTER — Encounter: Payer: Self-pay | Admitting: Nurse Practitioner

## 2022-02-27 ENCOUNTER — Ambulatory Visit (INDEPENDENT_AMBULATORY_CARE_PROVIDER_SITE_OTHER): Payer: Medicaid Other | Admitting: Nurse Practitioner

## 2022-02-27 VITALS — BP 104/65 | HR 88 | Temp 98.0°F | Resp 16 | Ht 69.0 in | Wt 222.6 lb

## 2022-02-27 DIAGNOSIS — E782 Mixed hyperlipidemia: Secondary | ICD-10-CM

## 2022-02-27 DIAGNOSIS — Z79899 Other long term (current) drug therapy: Secondary | ICD-10-CM | POA: Diagnosis not present

## 2022-02-27 DIAGNOSIS — E1169 Type 2 diabetes mellitus with other specified complication: Secondary | ICD-10-CM | POA: Diagnosis not present

## 2022-02-27 DIAGNOSIS — Z760 Encounter for issue of repeat prescription: Secondary | ICD-10-CM

## 2022-02-27 DIAGNOSIS — E1140 Type 2 diabetes mellitus with diabetic neuropathy, unspecified: Secondary | ICD-10-CM | POA: Diagnosis not present

## 2022-02-27 DIAGNOSIS — B351 Tinea unguium: Secondary | ICD-10-CM

## 2022-02-27 LAB — POCT GLYCOSYLATED HEMOGLOBIN (HGB A1C): Hemoglobin A1C: 5.9 % — AB (ref 4.0–5.6)

## 2022-02-27 MED ORDER — TERBINAFINE HCL 250 MG PO TABS: 250.0000 mg | ORAL_TABLET | Freq: Every day | ORAL | 0 refills | Status: DC

## 2022-02-27 MED ORDER — GABAPENTIN 600 MG PO TABS: 600.0000 mg | ORAL_TABLET | Freq: Two times a day (BID) | ORAL | 3 refills | Status: DC

## 2022-02-27 MED ORDER — FUROSEMIDE 20 MG PO TABS: 20.0000 mg | ORAL_TABLET | Freq: Every day | ORAL | 2 refills | Status: DC

## 2022-02-27 MED ORDER — MELOXICAM 15 MG PO TABS: 15.0000 mg | ORAL_TABLET | Freq: Every day | ORAL | 2 refills | Status: DC
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
## 2022-03-04 ENCOUNTER — Encounter: Payer: Self-pay | Admitting: Nurse Practitioner

## 2022-05-03 ENCOUNTER — Other Ambulatory Visit: Payer: Self-pay | Admitting: Nurse Practitioner

## 2022-05-03 DIAGNOSIS — E1169 Type 2 diabetes mellitus with other specified complication: Secondary | ICD-10-CM

## 2022-06-01 ENCOUNTER — Encounter: Payer: Self-pay | Admitting: Nurse Practitioner

## 2022-06-01 ENCOUNTER — Ambulatory Visit (INDEPENDENT_AMBULATORY_CARE_PROVIDER_SITE_OTHER): Payer: Medicaid Other | Admitting: Nurse Practitioner

## 2022-06-01 VITALS — BP 123/71 | HR 65 | Temp 98.4°F | Resp 16 | Ht 69.0 in | Wt 206.4 lb

## 2022-06-01 DIAGNOSIS — Z79899 Other long term (current) drug therapy: Secondary | ICD-10-CM

## 2022-06-01 DIAGNOSIS — I10 Essential (primary) hypertension: Secondary | ICD-10-CM | POA: Diagnosis not present

## 2022-06-01 DIAGNOSIS — E1140 Type 2 diabetes mellitus with diabetic neuropathy, unspecified: Secondary | ICD-10-CM | POA: Diagnosis not present

## 2022-06-01 MED ORDER — LISINOPRIL 20 MG PO TABS: 20.0000 mg | ORAL_TABLET | Freq: Every day | ORAL | 3 refills | Status: DC

## 2022-06-01 MED ORDER — FUROSEMIDE 20 MG PO TABS: 20.0000 mg | ORAL_TABLET | Freq: Every day | ORAL | 2 refills | Status: DC

## 2022-06-01 MED ORDER — ROSUVASTATIN CALCIUM 20 MG PO TABS: 20.0000 mg | ORAL_TABLET | Freq: Every day | ORAL | 1 refills | Status: DC

## 2022-06-01 MED ORDER — METFORMIN HCL 500 MG PO TABS: 500.0000 mg | ORAL_TABLET | Freq: Two times a day (BID) | ORAL | 3 refills | Status: DC

## 2022-06-01 MED ORDER — CYCLOBENZAPRINE HCL 10 MG PO TABS: 10.0000 mg | ORAL_TABLET | Freq: Every day | ORAL | 2 refills | Status: DC

## 2022-06-01 MED ORDER — GABAPENTIN 600 MG PO TABS: 600.0000 mg | ORAL_TABLET | Freq: Two times a day (BID) | ORAL | 3 refills | Status: DC

## 2022-06-01 MED ORDER — PANTOPRAZOLE SODIUM 40 MG PO TBEC: 40.0000 mg | DELAYED_RELEASE_TABLET | Freq: Every day | ORAL | 2 refills | Status: DC
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
## 2022-06-07 ENCOUNTER — Other Ambulatory Visit: Payer: Self-pay | Admitting: Nurse Practitioner

## 2022-06-07 DIAGNOSIS — Z79899 Other long term (current) drug therapy: Secondary | ICD-10-CM

## 2022-06-30 ENCOUNTER — Other Ambulatory Visit: Payer: Self-pay | Admitting: Nurse Practitioner

## 2022-08-23 ENCOUNTER — Other Ambulatory Visit: Payer: Self-pay | Admitting: Nurse Practitioner

## 2022-08-23 DIAGNOSIS — Z79899 Other long term (current) drug therapy: Secondary | ICD-10-CM

## 2022-09-05 ENCOUNTER — Encounter: Payer: Self-pay | Admitting: Nurse Practitioner

## 2022-09-05 ENCOUNTER — Other Ambulatory Visit: Payer: Self-pay

## 2022-09-05 ENCOUNTER — Ambulatory Visit: Payer: Medicaid Other | Admitting: Nurse Practitioner

## 2022-09-05 VITALS — BP 136/78 | HR 91 | Temp 98.6°F | Resp 16 | Ht 69.0 in | Wt 211.0 lb

## 2022-09-05 DIAGNOSIS — E1169 Type 2 diabetes mellitus with other specified complication: Secondary | ICD-10-CM

## 2022-09-05 DIAGNOSIS — B351 Tinea unguium: Secondary | ICD-10-CM

## 2022-09-05 DIAGNOSIS — K7469 Other cirrhosis of liver: Secondary | ICD-10-CM | POA: Diagnosis not present

## 2022-09-05 DIAGNOSIS — E1140 Type 2 diabetes mellitus with diabetic neuropathy, unspecified: Secondary | ICD-10-CM

## 2022-09-05 DIAGNOSIS — E782 Mixed hyperlipidemia: Secondary | ICD-10-CM | POA: Diagnosis not present

## 2022-09-05 DIAGNOSIS — E559 Vitamin D deficiency, unspecified: Secondary | ICD-10-CM

## 2022-09-05 DIAGNOSIS — H1013 Acute atopic conjunctivitis, bilateral: Secondary | ICD-10-CM

## 2022-09-05 DIAGNOSIS — E538 Deficiency of other specified B group vitamins: Secondary | ICD-10-CM

## 2022-09-05 DIAGNOSIS — Z79899 Other long term (current) drug therapy: Secondary | ICD-10-CM

## 2022-09-05 DIAGNOSIS — Z125 Encounter for screening for malignant neoplasm of prostate: Secondary | ICD-10-CM

## 2022-09-05 LAB — POCT GLYCOSYLATED HEMOGLOBIN (HGB A1C): Hemoglobin A1C: 5.4 % (ref 4.0–5.6)

## 2022-09-05 MED ORDER — MELOXICAM 15 MG PO TABS
15.0000 mg | ORAL_TABLET | Freq: Every day | ORAL | 3 refills | Status: DC
Start: 2022-09-05 — End: 2023-06-12

## 2022-09-05 MED ORDER — OLOPATADINE HCL 0.2 % OP SOLN: OPHTHALMIC | 5 refills | Status: AC

## 2022-09-05 MED ORDER — FUROSEMIDE 20 MG PO TABS
20.0000 mg | ORAL_TABLET | Freq: Every day | ORAL | 3 refills | Status: DC
Start: 2022-09-05 — End: 2023-06-12

## 2022-09-05 MED ORDER — GABAPENTIN 600 MG PO TABS
600.0000 mg | ORAL_TABLET | Freq: Two times a day (BID) | ORAL | 3 refills | Status: DC
Start: 2022-09-05 — End: 2023-06-12

## 2022-09-05 MED ORDER — METFORMIN HCL 500 MG PO TABS
500.0000 mg | ORAL_TABLET | Freq: Two times a day (BID) | ORAL | 3 refills | Status: DC
Start: 2022-09-05 — End: 2023-06-12

## 2022-09-05 MED ORDER — PANTOPRAZOLE SODIUM 40 MG PO TBEC
40.0000 mg | DELAYED_RELEASE_TABLET | Freq: Every day | ORAL | 2 refills | Status: DC
Start: 2022-09-05 — End: 2023-02-12

## 2022-09-05 MED ORDER — TERBINAFINE HCL 250 MG PO TABS
250.0000 mg | ORAL_TABLET | Freq: Every day | ORAL | 0 refills | Status: DC
Start: 2022-09-05 — End: 2023-02-12
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
## 2022-10-20 ENCOUNTER — Other Ambulatory Visit: Payer: Self-pay | Admitting: Gastroenterology

## 2022-10-20 DIAGNOSIS — K760 Fatty (change of) liver, not elsewhere classified: Secondary | ICD-10-CM

## 2022-11-08 ENCOUNTER — Ambulatory Visit: Payer: Medicaid Other | Admitting: Nurse Practitioner

## 2022-11-08 ENCOUNTER — Encounter: Payer: Self-pay | Admitting: Nurse Practitioner

## 2022-11-08 VITALS — BP 110/70 | HR 73 | Temp 98.8°F | Resp 16 | Ht 69.0 in | Wt 225.6 lb

## 2022-11-08 DIAGNOSIS — E559 Vitamin D deficiency, unspecified: Secondary | ICD-10-CM

## 2022-11-08 DIAGNOSIS — E785 Hyperlipidemia, unspecified: Secondary | ICD-10-CM

## 2022-11-08 DIAGNOSIS — Z23 Encounter for immunization: Secondary | ICD-10-CM

## 2022-11-08 DIAGNOSIS — E1169 Type 2 diabetes mellitus with other specified complication: Secondary | ICD-10-CM

## 2022-11-08 DIAGNOSIS — K7469 Other cirrhosis of liver: Secondary | ICD-10-CM | POA: Diagnosis not present

## 2022-11-08 DIAGNOSIS — Z0001 Encounter for general adult medical examination with abnormal findings: Secondary | ICD-10-CM

## 2022-11-08 DIAGNOSIS — E1140 Type 2 diabetes mellitus with diabetic neuropathy, unspecified: Secondary | ICD-10-CM

## 2022-11-08 DIAGNOSIS — B351 Tinea unguium: Secondary | ICD-10-CM

## 2022-11-08 DIAGNOSIS — E538 Deficiency of other specified B group vitamins: Secondary | ICD-10-CM

## 2022-11-08 DIAGNOSIS — E782 Mixed hyperlipidemia: Secondary | ICD-10-CM

## 2022-11-08 DIAGNOSIS — G720 Drug-induced myopathy: Secondary | ICD-10-CM

## 2022-11-08 DIAGNOSIS — Z79899 Other long term (current) drug therapy: Secondary | ICD-10-CM

## 2022-11-08 MED ORDER — CYCLOBENZAPRINE HCL 10 MG PO TABS
10.0000 mg | ORAL_TABLET | Freq: Every day | ORAL | 5 refills | Status: DC
Start: 2022-11-08 — End: 2023-02-12

## 2022-11-08 MED ORDER — LOVASTATIN 20 MG PO TABS
20.0000 mg | ORAL_TABLET | Freq: Every day | ORAL | 1 refills | Status: DC
Start: 2022-11-08 — End: 2023-02-12

## 2022-11-08 MED ORDER — VITAMIN D (ERGOCALCIFEROL) 1.25 MG (50000 UNIT) PO CAPS: 50000.0000 [IU] | ORAL_CAPSULE | ORAL | 1 refills | Status: DC

## 2022-11-08 MED ORDER — CYANOCOBALAMIN 1000 MCG/ML IJ SOLN
1000.0000 ug | Freq: Once | INTRAMUSCULAR | Status: AC
Start: 2022-11-08 — End: 2022-11-08
  Administered 2022-11-08: 1000 ug via INTRAMUSCULAR
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
## 2022-11-13 ENCOUNTER — Ambulatory Visit: Payer: Medicaid Other

## 2022-11-15 ENCOUNTER — Ambulatory Visit
Admission: RE | Admit: 2022-11-15 | Discharge: 2022-11-15 | Disposition: A | Discharge status: Home / Self Care | Payer: Medicaid Other | Source: Ambulatory Visit | Attending: Gastroenterology | Admitting: Gastroenterology

## 2022-11-15 DIAGNOSIS — K760 Fatty (change of) liver, not elsewhere classified: Secondary | ICD-10-CM

## 2022-11-27 ENCOUNTER — Telehealth: Payer: Self-pay

## 2022-11-29 ENCOUNTER — Telehealth: Payer: Self-pay

## 2022-11-29 NOTE — Telephone Encounter (Signed)
Lmom that which company he is getting diabetic shoe

## 2022-12-04 ENCOUNTER — Encounter: Payer: Self-pay | Admitting: Nurse Practitioner

## 2022-12-04 ENCOUNTER — Telehealth: Payer: Self-pay

## 2022-12-04 NOTE — Telephone Encounter (Signed)
Faxed [employer] for diabetic shoe

## 2022-12-12 NOTE — Telephone Encounter (Signed)
Papers have been faxed.  

## 2023-01-17 ENCOUNTER — Other Ambulatory Visit: Payer: Self-pay | Admitting: Nurse Practitioner

## 2023-01-17 DIAGNOSIS — B351 Tinea unguium: Secondary | ICD-10-CM

## 2023-02-09 ENCOUNTER — Telehealth: Payer: Self-pay | Admitting: Nurse Practitioner

## 2023-02-12 ENCOUNTER — Ambulatory Visit: Payer: Medicaid Other | Admitting: Nurse Practitioner

## 2023-02-12 ENCOUNTER — Encounter: Payer: Self-pay | Admitting: Nurse Practitioner

## 2023-02-12 VITALS — BP 100/70 | HR 102 | Temp 98.6°F | Resp 16 | Ht 69.0 in | Wt 232.0 lb

## 2023-02-12 DIAGNOSIS — E1169 Type 2 diabetes mellitus with other specified complication: Secondary | ICD-10-CM

## 2023-02-12 DIAGNOSIS — E785 Hyperlipidemia, unspecified: Secondary | ICD-10-CM

## 2023-02-12 DIAGNOSIS — E1140 Type 2 diabetes mellitus with diabetic neuropathy, unspecified: Secondary | ICD-10-CM | POA: Diagnosis not present

## 2023-02-12 DIAGNOSIS — G8929 Other chronic pain: Secondary | ICD-10-CM

## 2023-02-12 DIAGNOSIS — Z79899 Other long term (current) drug therapy: Secondary | ICD-10-CM | POA: Diagnosis not present

## 2023-02-12 DIAGNOSIS — M5442 Lumbago with sciatica, left side: Secondary | ICD-10-CM | POA: Diagnosis not present

## 2023-02-12 DIAGNOSIS — M5441 Lumbago with sciatica, right side: Secondary | ICD-10-CM

## 2023-02-12 LAB — POCT GLYCOSYLATED HEMOGLOBIN (HGB A1C): Hemoglobin A1C: 6.1 % — AB (ref 4.0–5.6)

## 2023-02-12 MED ORDER — PANTOPRAZOLE SODIUM 40 MG PO TBEC
40.0000 mg | DELAYED_RELEASE_TABLET | Freq: Every day | ORAL | 2 refills | Status: DC
Start: 2023-02-12 — End: 2023-10-09

## 2023-02-12 MED ORDER — LOVASTATIN 20 MG PO TABS
20.0000 mg | ORAL_TABLET | Freq: Every day | ORAL | 1 refills | Status: DC
Start: 2023-02-12 — End: 2023-06-12

## 2023-02-12 MED ORDER — CYCLOBENZAPRINE HCL 10 MG PO TABS: 10.0000 mg | ORAL_TABLET | Freq: Two times a day (BID) | ORAL | 5 refills | Status: DC | PRN

## 2023-02-12 NOTE — Telephone Encounter (Signed)
 Patient was notified that he has to call his insurance.
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
## 2023-03-08 ENCOUNTER — Other Ambulatory Visit: Payer: Self-pay

## 2023-03-08 DIAGNOSIS — E559 Vitamin D deficiency, unspecified: Secondary | ICD-10-CM

## 2023-03-08 MED ORDER — VITAMIN D (ERGOCALCIFEROL) 1.25 MG (50000 UNIT) PO CAPS: 50000.0000 [IU] | ORAL_CAPSULE | ORAL | 1 refills | Status: DC

## 2023-03-09 ENCOUNTER — Other Ambulatory Visit: Payer: Self-pay | Admitting: Gastroenterology

## 2023-03-09 DIAGNOSIS — K760 Fatty (change of) liver, not elsewhere classified: Secondary | ICD-10-CM

## 2023-03-22 ENCOUNTER — Ambulatory Visit
Admission: RE | Admit: 2023-03-22 | Discharge: 2023-03-22 | Disposition: A | Discharge status: Home / Self Care | Payer: Medicaid Other | Source: Ambulatory Visit | Attending: Gastroenterology | Admitting: Gastroenterology

## 2023-03-22 DIAGNOSIS — K760 Fatty (change of) liver, not elsewhere classified: Secondary | ICD-10-CM | POA: Insufficient documentation

## 2023-06-12 ENCOUNTER — Ambulatory Visit: Payer: Medicaid Other | Admitting: Nurse Practitioner

## 2023-06-12 ENCOUNTER — Encounter: Payer: Self-pay | Admitting: Nurse Practitioner

## 2023-06-12 VITALS — BP 118/63 | HR 83 | Temp 98.9°F | Resp 16 | Ht 69.0 in | Wt 235.0 lb

## 2023-06-12 DIAGNOSIS — E1159 Type 2 diabetes mellitus with other circulatory complications: Secondary | ICD-10-CM | POA: Diagnosis not present

## 2023-06-12 DIAGNOSIS — G8929 Other chronic pain: Secondary | ICD-10-CM

## 2023-06-12 DIAGNOSIS — E785 Hyperlipidemia, unspecified: Secondary | ICD-10-CM

## 2023-06-12 DIAGNOSIS — M19041 Primary osteoarthritis, right hand: Secondary | ICD-10-CM | POA: Diagnosis not present

## 2023-06-12 DIAGNOSIS — E1140 Type 2 diabetes mellitus with diabetic neuropathy, unspecified: Secondary | ICD-10-CM

## 2023-06-12 DIAGNOSIS — E1169 Type 2 diabetes mellitus with other specified complication: Secondary | ICD-10-CM

## 2023-06-12 DIAGNOSIS — I152 Hypertension secondary to endocrine disorders: Secondary | ICD-10-CM

## 2023-06-12 DIAGNOSIS — M19042 Primary osteoarthritis, left hand: Secondary | ICD-10-CM

## 2023-06-12 DIAGNOSIS — M5441 Lumbago with sciatica, right side: Secondary | ICD-10-CM

## 2023-06-12 DIAGNOSIS — M5442 Lumbago with sciatica, left side: Secondary | ICD-10-CM

## 2023-06-12 LAB — POCT GLYCOSYLATED HEMOGLOBIN (HGB A1C): Hemoglobin A1C: 6.4 % — AB (ref 4.0–5.6)

## 2023-06-12 MED ORDER — CYCLOBENZAPRINE HCL 10 MG PO TABS: 10.0000 mg | ORAL_TABLET | Freq: Two times a day (BID) | ORAL | 5 refills | Status: DC | PRN

## 2023-06-12 MED ORDER — LOVASTATIN 20 MG PO TABS
20.0000 mg | ORAL_TABLET | Freq: Every day | ORAL | 1 refills | Status: DC
Start: 2023-06-12 — End: 2023-11-14

## 2023-06-12 MED ORDER — LISINOPRIL 20 MG PO TABS: 20.0000 mg | ORAL_TABLET | Freq: Every day | ORAL | 3 refills | Status: DC

## 2023-06-12 MED ORDER — GABAPENTIN 600 MG PO TABS: 600.0000 mg | ORAL_TABLET | Freq: Two times a day (BID) | ORAL | 3 refills | Status: AC

## 2023-06-12 MED ORDER — CELECOXIB 200 MG PO CAPS: 200.0000 mg | ORAL_CAPSULE | Freq: Two times a day (BID) | ORAL | 5 refills | Status: DC

## 2023-06-12 MED ORDER — FUROSEMIDE 20 MG PO TABS: 20.0000 mg | ORAL_TABLET | Freq: Every day | ORAL | 3 refills | Status: AC

## 2023-06-12 MED ORDER — METFORMIN HCL 500 MG PO TABS: 500.0000 mg | ORAL_TABLET | Freq: Two times a day (BID) | ORAL | 3 refills | Status: DC

## 2023-06-12 NOTE — Patient Instructions (Signed)
 STOP meloxicam   START celecoxib 200 mg twice daily
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
## 2023-06-24 ENCOUNTER — Encounter: Payer: Self-pay | Admitting: Nurse Practitioner

## 2023-09-10 ENCOUNTER — Other Ambulatory Visit: Payer: Self-pay | Admitting: Gastroenterology

## 2023-09-10 DIAGNOSIS — K760 Fatty (change of) liver, not elsewhere classified: Secondary | ICD-10-CM

## 2023-09-27 ENCOUNTER — Ambulatory Visit
Admission: RE | Admit: 2023-09-27 | Discharge: 2023-09-27 | Disposition: A | Discharge status: Home / Self Care | Source: Ambulatory Visit | Attending: Gastroenterology | Admitting: Gastroenterology

## 2023-09-27 DIAGNOSIS — K760 Fatty (change of) liver, not elsewhere classified: Secondary | ICD-10-CM | POA: Diagnosis present

## 2023-10-09 ENCOUNTER — Encounter: Payer: Self-pay | Admitting: Nurse Practitioner

## 2023-10-09 ENCOUNTER — Ambulatory Visit: Admitting: Nurse Practitioner

## 2023-10-09 VITALS — BP 126/74 | HR 75 | Temp 97.9°F | Resp 16 | Ht 69.0 in | Wt 232.4 lb

## 2023-10-09 DIAGNOSIS — E1169 Type 2 diabetes mellitus with other specified complication: Secondary | ICD-10-CM | POA: Diagnosis not present

## 2023-10-09 DIAGNOSIS — M5442 Lumbago with sciatica, left side: Secondary | ICD-10-CM

## 2023-10-09 DIAGNOSIS — E1140 Type 2 diabetes mellitus with diabetic neuropathy, unspecified: Secondary | ICD-10-CM | POA: Diagnosis not present

## 2023-10-09 DIAGNOSIS — M5441 Lumbago with sciatica, right side: Secondary | ICD-10-CM

## 2023-10-09 DIAGNOSIS — E785 Hyperlipidemia, unspecified: Secondary | ICD-10-CM

## 2023-10-09 DIAGNOSIS — K760 Fatty (change of) liver, not elsewhere classified: Secondary | ICD-10-CM | POA: Insufficient documentation

## 2023-10-09 DIAGNOSIS — E559 Vitamin D deficiency, unspecified: Secondary | ICD-10-CM | POA: Insufficient documentation

## 2023-10-09 DIAGNOSIS — E1159 Type 2 diabetes mellitus with other circulatory complications: Secondary | ICD-10-CM

## 2023-10-09 DIAGNOSIS — K219 Gastro-esophageal reflux disease without esophagitis: Secondary | ICD-10-CM

## 2023-10-09 DIAGNOSIS — Z91030 Bee allergy status: Secondary | ICD-10-CM

## 2023-10-09 DIAGNOSIS — G8929 Other chronic pain: Secondary | ICD-10-CM

## 2023-10-09 DIAGNOSIS — E538 Deficiency of other specified B group vitamins: Secondary | ICD-10-CM

## 2023-10-09 DIAGNOSIS — I152 Hypertension secondary to endocrine disorders: Secondary | ICD-10-CM

## 2023-10-09 LAB — POCT GLYCOSYLATED HEMOGLOBIN (HGB A1C): Hemoglobin A1C: 6.5 % — AB (ref 4.0–5.6)

## 2023-10-09 MED ORDER — MELOXICAM 15 MG PO TABS: 15.0000 mg | ORAL_TABLET | Freq: Every day | ORAL | 3 refills | Status: AC

## 2023-10-09 MED ORDER — EPINEPHRINE 0.3 MG/0.3ML IJ SOAJ
0.3000 mg | INTRAMUSCULAR | 5 refills | Status: AC | PRN
Start: 2023-10-09 — End: ?

## 2023-10-09 MED ORDER — VITAMIN D (ERGOCALCIFEROL) 1.25 MG (50000 UNIT) PO CAPS: 50000.0000 [IU] | ORAL_CAPSULE | ORAL | 1 refills | Status: DC

## 2023-10-09 MED ORDER — PANTOPRAZOLE SODIUM 40 MG PO TBEC: 40.0000 mg | DELAYED_RELEASE_TABLET | Freq: Every day | ORAL | 3 refills | Status: AC

## 2023-10-09 MED ORDER — CYCLOBENZAPRINE HCL 10 MG PO TABS: 10.0000 mg | ORAL_TABLET | Freq: Two times a day (BID) | ORAL | 3 refills | Status: DC | PRN
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
## 2023-11-10 LAB — B12 AND FOLATE PANEL
Folate: 10.1 ng/mL (ref >3.0)
Vitamin B-12: 535 pg/mL (ref 232–1245)

## 2023-11-10 LAB — LIPID PANEL
Chol/HDL Ratio: 3.6 ratio (ref 0.0–5.0)
Cholesterol, Total: 197 mg/dL (ref 100–199)
HDL: 54 mg/dL (ref >39)
LDL Chol Calc (NIH): 121 mg/dL — ABNORMAL HIGH (ref 0–99)
Triglycerides: 123 mg/dL (ref 0–149)
VLDL Cholesterol Cal: 22 mg/dL (ref 5–40)

## 2023-11-10 LAB — VITAMIN D 25 HYDROXY (VIT D DEFICIENCY, FRACTURES): Vit D, 25-Hydroxy: 39.9 ng/mL (ref 30.0–100.0)

## 2023-11-14 ENCOUNTER — Encounter: Payer: Self-pay | Admitting: Nurse Practitioner

## 2023-11-14 ENCOUNTER — Ambulatory Visit: Payer: Medicaid Other | Admitting: Nurse Practitioner

## 2023-11-14 VITALS — BP 134/76 | HR 79 | Temp 97.2°F | Resp 16 | Ht 69.0 in | Wt 226.0 lb

## 2023-11-14 DIAGNOSIS — G8929 Other chronic pain: Secondary | ICD-10-CM

## 2023-11-14 DIAGNOSIS — E1169 Type 2 diabetes mellitus with other specified complication: Secondary | ICD-10-CM

## 2023-11-14 DIAGNOSIS — M5442 Lumbago with sciatica, left side: Secondary | ICD-10-CM | POA: Diagnosis not present

## 2023-11-14 DIAGNOSIS — Z0001 Encounter for general adult medical examination with abnormal findings: Secondary | ICD-10-CM | POA: Diagnosis not present

## 2023-11-14 DIAGNOSIS — E559 Vitamin D deficiency, unspecified: Secondary | ICD-10-CM

## 2023-11-14 DIAGNOSIS — E1140 Type 2 diabetes mellitus with diabetic neuropathy, unspecified: Secondary | ICD-10-CM | POA: Diagnosis not present

## 2023-11-14 DIAGNOSIS — Z23 Encounter for immunization: Secondary | ICD-10-CM | POA: Diagnosis not present

## 2023-11-14 DIAGNOSIS — M5441 Lumbago with sciatica, right side: Secondary | ICD-10-CM

## 2023-11-14 DIAGNOSIS — E785 Hyperlipidemia, unspecified: Secondary | ICD-10-CM

## 2023-11-14 LAB — CREATININE
Creatinine: 1.1 (ref 0.6–1.3)
eGFR: 75

## 2023-11-14 MED ORDER — LOVASTATIN 20 MG PO TABS: 20.0000 mg | ORAL_TABLET | Freq: Every day | ORAL | 1 refills | Status: DC

## 2023-11-14 MED ORDER — VITAMIN D (ERGOCALCIFEROL) 1.25 MG (50000 UNIT) PO CAPS: 50000.0000 [IU] | ORAL_CAPSULE | ORAL | 1 refills | Status: DC

## 2023-11-14 MED ORDER — CYCLOBENZAPRINE HCL 10 MG PO TABS: 10.0000 mg | ORAL_TABLET | Freq: Two times a day (BID) | ORAL | 3 refills | Status: AC | PRN

## 2023-11-14 MED ORDER — METFORMIN HCL 500 MG PO TABS: 500.0000 mg | ORAL_TABLET | Freq: Two times a day (BID) | ORAL | 3 refills | Status: AC
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
## 2023-11-27 LAB — MICROALBUMIN / CREATININE URINE RATIO
Creatinine, Urine: 68.4 mg/dL
Microalb/Creat Ratio: <4 mg/g{creat} (ref 0–29)
Microalbumin, Urine: <3 ug/mL

## 2024-02-18 ENCOUNTER — Encounter: Payer: Self-pay | Admitting: Nurse Practitioner

## 2024-02-18 ENCOUNTER — Ambulatory Visit (INDEPENDENT_AMBULATORY_CARE_PROVIDER_SITE_OTHER): Admitting: Nurse Practitioner

## 2024-02-18 VITALS — BP 130/76 | HR 91 | Temp 96.3°F | Resp 16 | Ht 69.0 in | Wt 220.8 lb

## 2024-02-18 DIAGNOSIS — E1169 Type 2 diabetes mellitus with other specified complication: Secondary | ICD-10-CM | POA: Diagnosis not present

## 2024-02-18 DIAGNOSIS — E1140 Type 2 diabetes mellitus with diabetic neuropathy, unspecified: Secondary | ICD-10-CM | POA: Diagnosis not present

## 2024-02-18 DIAGNOSIS — G8929 Other chronic pain: Secondary | ICD-10-CM | POA: Diagnosis not present

## 2024-02-18 DIAGNOSIS — M5441 Lumbago with sciatica, right side: Secondary | ICD-10-CM

## 2024-02-18 DIAGNOSIS — I152 Hypertension secondary to endocrine disorders: Secondary | ICD-10-CM | POA: Diagnosis not present

## 2024-02-18 DIAGNOSIS — M5442 Lumbago with sciatica, left side: Secondary | ICD-10-CM | POA: Diagnosis not present

## 2024-02-18 DIAGNOSIS — K760 Fatty (change of) liver, not elsewhere classified: Secondary | ICD-10-CM

## 2024-02-18 DIAGNOSIS — E785 Hyperlipidemia, unspecified: Secondary | ICD-10-CM

## 2024-02-18 DIAGNOSIS — E1159 Type 2 diabetes mellitus with other circulatory complications: Secondary | ICD-10-CM | POA: Diagnosis not present

## 2024-02-18 DIAGNOSIS — E559 Vitamin D deficiency, unspecified: Secondary | ICD-10-CM

## 2024-02-18 LAB — POCT GLYCOSYLATED HEMOGLOBIN (HGB A1C): Hemoglobin A1C: 6 % — AB (ref 4.0–5.6)

## 2024-02-18 MED ORDER — VITAMIN D (ERGOCALCIFEROL) 1.25 MG (50000 UNIT) PO CAPS: 50000.0000 [IU] | ORAL_CAPSULE | ORAL | 1 refills | Status: AC

## 2024-02-18 MED ORDER — LISINOPRIL 20 MG PO TABS: 20.0000 mg | ORAL_TABLET | Freq: Every day | ORAL | 3 refills | Status: AC

## 2024-02-18 MED ORDER — LOVASTATIN 20 MG PO TABS: 20.0000 mg | ORAL_TABLET | Freq: Every day | ORAL | 1 refills | Status: AC
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
## 2024-07-21 ENCOUNTER — Ambulatory Visit: Admitting: Nurse Practitioner

## 2024-11-14 ENCOUNTER — Encounter: Admitting: Nurse Practitioner
